# Patient Record
Sex: Female | Born: 1975
Health system: Southern US, Community
[De-identification: ages and names within clinical notes are randomized; demographics above are authoritative.]

## PROBLEM LIST (undated history)

## (undated) DIAGNOSIS — K219 Gastro-esophageal reflux disease without esophagitis: Secondary | ICD-10-CM

## (undated) DIAGNOSIS — M722 Plantar fascial fibromatosis: Secondary | ICD-10-CM

## (undated) DIAGNOSIS — E039 Hypothyroidism, unspecified: Secondary | ICD-10-CM

## (undated) DIAGNOSIS — Z973 Presence of spectacles and contact lenses: Secondary | ICD-10-CM

## (undated) DIAGNOSIS — D649 Anemia, unspecified: Secondary | ICD-10-CM

## (undated) DIAGNOSIS — G43909 Migraine, unspecified, not intractable, without status migrainosus: Secondary | ICD-10-CM

## (undated) DIAGNOSIS — R112 Nausea with vomiting, unspecified: Secondary | ICD-10-CM

## (undated) DIAGNOSIS — Q2112 Patent foramen ovale: Secondary | ICD-10-CM

## (undated) DIAGNOSIS — K449 Diaphragmatic hernia without obstruction or gangrene: Secondary | ICD-10-CM

## (undated) DIAGNOSIS — Z9889 Other specified postprocedural states: Secondary | ICD-10-CM

## (undated) DIAGNOSIS — Z8489 Family history of other specified conditions: Secondary | ICD-10-CM

## (undated) DIAGNOSIS — T8859XA Other complications of anesthesia, initial encounter: Secondary | ICD-10-CM

## (undated) DIAGNOSIS — C801 Malignant (primary) neoplasm, unspecified: Secondary | ICD-10-CM

## (undated) HISTORY — DX: Plantar fascial fibromatosis: M72.2

## (undated) HISTORY — PX: AUGMENTATION MAMMAPLASTY: SUR837

## (undated) HISTORY — DX: Migraine, unspecified, not intractable, without status migrainosus: G43.909

---

## 1999-10-17 ENCOUNTER — Other Ambulatory Visit: Admission: RE | Admit: 1999-10-17 | Discharge: 1999-10-17 | Payer: Self-pay | Admitting: Unknown Physician Specialty

## 2000-10-19 ENCOUNTER — Other Ambulatory Visit: Admission: RE | Admit: 2000-10-19 | Discharge: 2000-10-19 | Payer: Self-pay | Admitting: Family Medicine

## 2001-10-22 ENCOUNTER — Other Ambulatory Visit: Admission: RE | Admit: 2001-10-22 | Discharge: 2001-10-22 | Payer: Self-pay | Admitting: Family Medicine

## 2002-04-07 HISTORY — PX: OTHER SURGICAL HISTORY: SHX169

## 2002-11-20 ENCOUNTER — Other Ambulatory Visit: Admission: RE | Admit: 2002-11-20 | Discharge: 2002-11-20 | Payer: Self-pay | Admitting: Family Medicine

## 2005-10-17 ENCOUNTER — Ambulatory Visit: Payer: Self-pay | Admitting: Family Medicine

## 2007-12-06 HISTORY — PX: TUBAL LIGATION: SHX77

## 2011-08-24 ENCOUNTER — Encounter (INDEPENDENT_AMBULATORY_CARE_PROVIDER_SITE_OTHER): Payer: Self-pay | Admitting: Surgery

## 2011-08-24 ENCOUNTER — Ambulatory Visit (INDEPENDENT_AMBULATORY_CARE_PROVIDER_SITE_OTHER): Payer: Commercial Managed Care - PPO | Admitting: Surgery

## 2011-08-24 NOTE — Progress Notes (Signed)
Re:   Suzanne Green DOB:   Dec 10, 1975 MRN:   [id number]  ASSESSMENT AND PLAN: 1.  Morbid obesity.  Per the 1991 NIH Consensus Statement, the patient is a candidate for bariatric surgery.  The patient attended our initial information session and reviewed the types of bariatric surgery.    The patient is interested in the Roux en Y Gastric Bypass.  I discussed with the patient the indications and risks of bariatric surgery.  The potential risks of surgery include, but are not limited to, bleeding, infection, leak from the bowel, DVT and PE, open surgery, long term nutrition consequences, and death.  We talked some about how the surgery for her retroperitoneal tumor could effect the gastric bypass surgery.  The patient understands the importance of compliance and long term follow-up with our group after surgery.  From here we will obtain lab tests, x-rays, nutrition consult, and psych consult.  I also talked about weight loss between today and her surgery.  That any weight she looses will only make her surgery safer.  2.  Migraines.  To become new patient of [facility]. 3.  Prior abdominal surgery for retroperitoneal tumor. 4.  Left ankle tendon injury - August 2012 5.  Plantar fasciitis.  Chief Complaint  Patient presents with  . Bariatric Pre-op    New Bariatric - Initial   REFERRING PHYSICIAN: Marylen Ponto, MD, MD  HISTORY OF PRESENT ILLNESS: Suzanne Green is a 36 y.o. (DOB: [birth year])  white female whose primary care physician is Marylen Ponto, MD, MD and comes to me today for bariatric surgery, she is leaning towards the gastric bypass. The patient has been overweight much of her adult life. She has been to one of our seminars, but cannot remember the physician who spoke. She is interested in a gastric bypass. She has tried Weight Watchers, Atkins diet, bariatric clinic, weight loss clinic, [employer], carpet modified diet, and calorie counting. She had the most  success with the bariatric clinic. They gave her phentermine in addition to her diet.  I have encouraged to also try to attend one of our support group meetings so she can meet someone who has gone through the surgery.  She did say that her husband has rotated through [facility] and taken care of some of the bariatric patients there.  She has a significant family history of coronary artery disease and early death.   Past Medical History  Diagnosis Date  . Migraines     since 1998     Past Surgical History  Procedure Date  . Cesarean section 12/26/07, 10/31/04  . Tubal ligation 12/2007  . Abdominal cyst removed 04/2002     Current Outpatient Prescriptions  Medication Sig Dispense Refill  . rizatriptan (MAXALT) 10 MG tablet Take 10 mg by mouth as needed. May repeat in 2 hours if needed        No Known Allergies  REVIEW OF SYSTEMS: Skin:  No history of rash.  No history of abnormal moles. Infection:  No history of hepatitis or HIV.  No history of MRSA. Neurologic:  History of migraine headaches well controlled on her current meds.  She is to see [facility].  She has never seen anyone there before and cannot remember the name of the doctor she is to see. Cardiac:  No history of hypertension. No history of heart disease.  No history of prior cardiac catheterization.  No history of seeing a cardiologist. Pulmonary:  Pneumothorax  at age 75. Treated with needle. Gets bronchitis easily.  Endocrine:  No diabetes. No thyroid disease. Gastrointestinal:  No history of stomach disease.  No history of liver disease.  No history of gall bladder disease.  No history of pancreas disease.  No history of colon disease. Urologic:  No history of kidney stones.  No history of bladder infections. Musculoskeletal:  Has plantar fasciitis.  Has torn 3 tendons in her right ankle.  Sees a podiatrist. Hematologic:  No bleeding disorder.  No history of anemia.  Not anticoagulated. Psycho-social:   The patient is oriented.   The patient has no obvious psychologic or social impairment to understanding our conversation and plan.  SOCIAL and FAMILY HISTORY: She works as a Engineer, civil (consulting) at [employer].  Her husband is also a Pharmacologist. She has three children: 17,6,3.  PHYSICAL EXAM: BP 110/70  Pulse 70  Temp(Src) 97.6 F (36.4 C) (Temporal)  Resp 18  Ht 5' 4.5" (1.638 m)  Wt 279 lb 3.2 oz (126.644 kg)  BMI 47.18 kg/m2  General: Obese WF who is alert and generally healthy appearing.  HEENT: Normal. Pupils equal. Good dentition. Neck: Supple. No mass.  No thyroid mass.  Carotid pulse okay with no bruit. Lymph Nodes:  No supraclavicular or cervical nodes. Lungs: Clear to auscultation and symmetric breath sounds. Heart:  RRR. No murmur or rub.  Abdomen: Soft. No mass. No tenderness. No hernia. Normal bowel sounds.  Infraumbilical mid line abdominal scar. Rectal: Guaiac neg stool and no mass. Extremities:  Good strength and ROM  in upper and lower extremities. Neurologic:  Grossly intact to motor and sensory function. Psychiatric: Has normal mood and affect. Behavior is normal.   DATA REVIEWED: Letter of support from Dr. Leonor Liv and other copied material in paper chart.  [name], MD,  [facility] [address] [place]  [address]   [place], [place]    [address] Phone:  [phone] FAX:  [phone]

## 2011-08-29 ENCOUNTER — Other Ambulatory Visit (INDEPENDENT_AMBULATORY_CARE_PROVIDER_SITE_OTHER): Payer: Self-pay

## 2011-08-29 DIAGNOSIS — E669 Obesity, unspecified: Secondary | ICD-10-CM

## 2011-08-30 ENCOUNTER — Encounter (HOSPITAL_COMMUNITY): Admission: RE | Payer: Self-pay | Source: Ambulatory Visit

## 2011-08-30 ENCOUNTER — Ambulatory Visit (HOSPITAL_COMMUNITY): Admission: RE | Admit: 2011-08-30 | Payer: Commercial Managed Care - PPO | Source: Ambulatory Visit | Admitting: Surgery

## 2011-08-30 SURGERY — BREATH TEST, FOR HELICOBACTER PYLORI

## 2011-09-05 ENCOUNTER — Other Ambulatory Visit: Payer: Self-pay

## 2011-09-05 ENCOUNTER — Ambulatory Visit (HOSPITAL_COMMUNITY)
Admission: RE | Admit: 2011-09-05 | Discharge: 2011-09-05 | Disposition: A | Discharge status: Home / Self Care | Payer: 59 | Source: Ambulatory Visit | Attending: Surgery | Admitting: Surgery

## 2011-09-05 DIAGNOSIS — K219 Gastro-esophageal reflux disease without esophagitis: Secondary | ICD-10-CM | POA: Insufficient documentation

## 2011-09-05 DIAGNOSIS — K7689 Other specified diseases of liver: Secondary | ICD-10-CM | POA: Insufficient documentation

## 2011-09-13 LAB — CBC WITH DIFFERENTIAL/PLATELET
Basophils Absolute: 0.1 10*3/uL (ref 0.0–0.1)
Basophils Relative: 1 % (ref 0–1)
Eosinophils Relative: 2 % (ref 0–5)
HCT: 38.1 % (ref 36.0–46.0)
Hemoglobin: 11.6 g/dL — ABNORMAL LOW (ref 12.0–15.0)
MCH: 27.4 pg (ref 26.0–34.0)
MCHC: 30.4 g/dL (ref 30.0–36.0)
MCV: 90.1 fL (ref 78.0–100.0)
Monocytes Absolute: 0.6 10*3/uL (ref 0.1–1.0)
Monocytes Relative: 7 % (ref 3–12)
Neutro Abs: 4.3 10*3/uL (ref 1.7–7.7)
RDW: 14.4 % (ref 11.5–15.5)

## 2011-09-13 LAB — COMPREHENSIVE METABOLIC PANEL
AST: 9 U/L (ref 0–37)
Albumin: 3.9 g/dL (ref 3.5–5.2)
Alkaline Phosphatase: 66 U/L (ref 39–117)
BUN: 18 mg/dL (ref 6–23)
Calcium: 9.6 mg/dL (ref 8.4–10.5)
Creat: 0.92 mg/dL (ref 0.50–1.10)
Glucose, Bld: 96 mg/dL (ref 70–99)
Potassium: 4.5 mEq/L (ref 3.5–5.3)

## 2011-09-14 ENCOUNTER — Encounter: Payer: Self-pay | Admitting: *Deleted

## 2011-09-14 ENCOUNTER — Encounter: Payer: 59 | Attending: Surgery | Admitting: *Deleted

## 2011-09-14 DIAGNOSIS — Z01818 Encounter for other preprocedural examination: Secondary | ICD-10-CM | POA: Insufficient documentation

## 2011-09-14 DIAGNOSIS — Z713 Dietary counseling and surveillance: Secondary | ICD-10-CM | POA: Insufficient documentation

## 2011-09-14 NOTE — Patient Instructions (Signed)
   Follow Pre-Op Nutrition Goals to prepare for Gastric Bypass Surgery.   Call the [facility] at [phone] once you have been given your surgery date to enrolled in the Pre-Op Nutrition Class. You will need to attend this nutrition class 3-4 weeks prior to your surgery. 

## 2011-09-14 NOTE — Progress Notes (Signed)
  Pre-Op Assessment Visit: Pre-Operative Gastric Bypass Surgery  Medical Nutrition Therapy:  Appt start time: 1100 end time:  1200.  Patient was seen on 09/14/2011 for Pre-Operative Gastric Bypass Nutrition Assessment. Assessment and letter of approval faxed to [facility] B[facility]ric [facility] Program coordinator on 09/14/2011.  Approval letter sent to [facility] and will be available in the chart under the media tab.  Handouts given during visit include:  Pre-Op Goals Handout  Patient to call for Pre-Op and Post-Op Nutrition Education at the [facility] when surgery is scheduled.

## 2011-09-18 ENCOUNTER — Encounter (HOSPITAL_COMMUNITY)
Admission: RE | Disposition: A | Discharge status: Home Health | Payer: Self-pay | Source: Ambulatory Visit | Attending: Surgery

## 2011-09-18 ENCOUNTER — Ambulatory Visit (HOSPITAL_COMMUNITY)
Admission: RE | Admit: 2011-09-18 | Discharge: 2011-09-18 | Disposition: A | Discharge status: Home Health | Payer: 59 | Source: Ambulatory Visit | Attending: Surgery | Admitting: Surgery

## 2011-09-18 DIAGNOSIS — Z01818 Encounter for other preprocedural examination: Secondary | ICD-10-CM | POA: Insufficient documentation

## 2011-09-18 HISTORY — PX: BREATH TEK H PYLORI: SHX5422

## 2011-09-18 SURGERY — BREATH TEST, FOR HELICOBACTER PYLORI

## 2011-09-19 ENCOUNTER — Encounter (HOSPITAL_COMMUNITY): Payer: Self-pay | Admitting: Surgery

## 2011-11-15 ENCOUNTER — Other Ambulatory Visit (INDEPENDENT_AMBULATORY_CARE_PROVIDER_SITE_OTHER): Payer: Self-pay | Admitting: Surgery

## 2011-11-30 ENCOUNTER — Ambulatory Visit (INDEPENDENT_AMBULATORY_CARE_PROVIDER_SITE_OTHER): Payer: Commercial Managed Care - PPO | Admitting: Surgery

## 2011-11-30 NOTE — Progress Notes (Signed)
Re:   Suzanne Green DOB:   Dec 02, 1975 MRN:   [id number]  ASSESSMENT AND PLAN: 1.  Morbid obesity.  Weight - 279, BMI - 47.2.  She has been through the pre op labs, x-rays, and evaluation.  For RYGB Dec 19, 2011.  The patient is interested in the Roux en Y Gastric Bypass.  I reviewed with the patient the indications and risks of bariatric surgery.  The potential risks of surgery include, but are not limited to, bleeding, infection, leak from the bowel, DVT and PE, open surgery, long term nutrition consequences, and death. We talked about the hospitalization.  The patient understands the importance of compliance and long term follow-up with our group after surgery.  I reviewed her tests with her. Her dad came with her to the office today.  2.  Migraines.  To become new patient of [facility]. 3.  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  Left ankle tendon injury - August 2012 5.  Plantar fasciitis.  Chief Complaint  Patient presents with  . Bariatric Pre-op   REFERRING PHYSICIAN: Marylen Ponto, MD, MD  HISTORY OF PRESENT ILLNESS: Suzanne Green is a 36 y.o. (DOB: [birth year])  white female whose primary care physician is Marylen Ponto, MD, MD and comes to me today for pre op visit for bariatric surgery.  She is scheduled for a gastric bypass.  She seems to understand the planned surgery, the hospitalization, post op course and potential complications.  She did not have a lot of questions.  Her dad is with her.  Her husband could not come with her because he works third shift in [employer].  She has lost a little bit of weight.  I encouraged he that the more she looses the better.  Her blood work looks good - TSH - 2.167 - 08/29/2011. UGI - 09/05/2011 - Trace of reflux, no HH. [place] - 09/05/2011 - Moderate steatosis. Dr. Cyndia Skeeters - 10/09/2011 - okay for surgery  She has a significant family history of coronary artery disease and early  death.   Past Medical History  Diagnosis Date  . Migraines     since 1998  . Plantar fasciitis      Past Surgical History  Procedure Date  . Cesarean section 12/26/07, 10/31/04  . Tubal ligation 12/2007  . Abdominal cyst removed 04/2002  . Breath tek h pylori 09/18/2011    Procedure: BREATH TEK H PYLORI;  Surgeon: Kandis Cocking, MD;  Location: [facility];  Service: General;  Laterality: N/A;     Current Outpatient Prescriptions  Medication Sig Dispense Refill  . ibuprofen (ADVIL,MOTRIN) 600 MG tablet Take 600 mg by mouth 3 (three) times daily as needed.      . rizatriptan (MAXALT) 10 MG tablet Take 10 mg by mouth as needed. May repeat in 2 hours if needed        No Known Allergies  REVIEW OF SYSTEMS: Skin:  No history of rash.  No history of abnormal moles. Infection:  No history of hepatitis or HIV.  No history of MRSA. Neurologic:  History of migraine headaches well controlled on her current meds.  She is to see [facility].  She has never seen anyone there before and cannot remember the name of the doctor she is to see. Cardiac:  No history of hypertension. No history of heart disease.  No history of prior cardiac catheterization.  No history of seeing a cardiologist. Pulmonary:  Pneumothorax at age 9. Treated with needle. Gets bronchitis easily.  Endocrine:  No diabetes. No thyroid disease. Gastrointestinal:  No history of stomach disease.  No history of liver disease.  No history of gall bladder disease.  No history of pancreas disease.  No history of colon disease. Urologic:  No history of kidney stones.  No history of bladder infections. Musculoskeletal:  Has plantar fasciitis.  Has torn 3 tendons in her right ankle.  Sees a podiatrist. Hematologic:  No bleeding disorder.  No history of anemia.  Not anticoagulated. Psycho-social:  The patient is oriented.   The patient has no obvious psychologic or social impairment to understanding our conversation and  plan.  SOCIAL and FAMILY HISTORY: She works as a Engineer, civil (consulting) at [employer].   Her husband is also a Pharmacologist.  I think he works at [employer]. She has three children: 17,6,3.  PHYSICAL EXAM: BP 120/88  Pulse 78  Temp(Src) 97.8 F (36.6 C) (Temporal)  Resp 20  Ht 5' 4.5" (1.638 m)  Wt 272 lb (123.378 kg)  BMI 45.97 kg/m2  General: Obese WF who is alert and generally healthy appearing.  HEENT: Normal. Pupils equal. Good dentition. Neck: Supple. No mass.  No thyroid mass.  Carotid pulse okay with no bruit. Lymph Nodes:  No supraclavicular or cervical nodes. Lungs: Clear to auscultation and symmetric breath sounds. Heart:  RRR. No murmur or rub.  Abdomen: Soft. No mass. No tenderness. No hernia. Normal bowel sounds.  Infraumbilical mid line abdominal scar. Rectal: Guaiac neg stool and no mass. Extremities:  Good strength and ROM  in upper and lower extremities. Neurologic:  Grossly intact to motor and sensory function. Psychiatric: Has normal mood and affect. Behavior is normal.   DATA REVIEWED: Labs and x-rays in HPI.  [name], MD,  [facility] [address] [place]  [address]   [place], [place]    [address] Phone:  [phone] FAX:  [phone]

## 2011-12-07 ENCOUNTER — Encounter: Payer: 59 | Attending: Surgery | Admitting: *Deleted

## 2011-12-07 ENCOUNTER — Encounter: Payer: Self-pay | Admitting: *Deleted

## 2011-12-07 DIAGNOSIS — Z01818 Encounter for other preprocedural examination: Secondary | ICD-10-CM | POA: Insufficient documentation

## 2011-12-07 DIAGNOSIS — Z713 Dietary counseling and surveillance: Secondary | ICD-10-CM | POA: Insufficient documentation

## 2011-12-07 NOTE — Progress Notes (Signed)
  Bariatric Class:  Appt start time: 0800 end time:  0930.  Pre-Operative Nutrition Class  Patient was seen on 12/07/2011 for Pre-Operative Bariatric Surgery Education at the [facility].  Surgery date: 12/19/11 Surgery type: RYGB Last weight: 272.0 lbs  Samples given per MNT protocol:  Celebrate Vitamins  MVI [facility]) - Lot # [id number]; Exp: 06/14  Calcium Citrate (Berries & Cream) - Lot # [id number]; Exp: 10/14  Sublingual B12 Valentino Saxon) - Lot # [id number]; Exp: 07/14 Bariatric Advantage  MVI [facility]) - Lot # [id number]; Exp: 09/13  MVI Allyson Sabal) - Lot # [id number] [id number]; Exp: 09/13  Calcium Citrate w/ Vit D (Chocolate) - Lot # [id number]; Exp: 09/13  Calcium Citrate w/ Vit D (Cinnamon) - Lot # [id number]; Exp: 09/13 Unjury Protein Powder  Vanilla - Lot # [id number]; Exp: 08/14  The following the learning objective met by the patient during this course:  Identifies Pre-Op Dietary Goals and will begin 2 weeks pre-operatively  Identifies appropriate sources of fluids and proteins   States protein recommendations and appropriate sources pre and post-operatively  Identifies Post-Operative Dietary Goals and will follow for 2 weeks post-operatively  Identifies appropriate multivitamin and calcium sources  Describes the need for physical activity post-operatively and will follow MD recommendations  States when to call healthcare provider regarding medication questions or post-operative complications  Handouts given during class include:  Pre-Op Bariatric Surgery Diet Handout  Protein Shake Handout  Post-Op Bariatric Surgery Nutrition Handout  BELT Program Information Flyer  Support Group Information Flyer  Follow-Up Plan: Patient will follow-up at [facility] 2 weeks post operatively for diet advancement per MD.

## 2011-12-07 NOTE — Patient Instructions (Signed)
Follow:   Pre-Op Diet per MD 2 weeks prior to surgery  Phase 2- Liquids (clear/full) 2 weeks after surgery  Vitamin/Mineral/Calcium guidelines for purchasing bariatric supplements  Exercise guidelines pre and post-op per MD  Follow-up at [facility] in 2 weeks post-op for diet advancement. Contact [place] as needed with questions/concerns. 

## 2011-12-13 ENCOUNTER — Encounter (HOSPITAL_COMMUNITY): Payer: Self-pay | Admitting: Pharmacy Technician

## 2011-12-14 ENCOUNTER — Encounter (HOSPITAL_COMMUNITY): Payer: Self-pay

## 2011-12-14 ENCOUNTER — Encounter (HOSPITAL_COMMUNITY)
Admission: RE | Admit: 2011-12-14 | Discharge: 2011-12-14 | Disposition: A | Discharge status: Home / Self Care | Payer: 59 | Source: Ambulatory Visit | Attending: Surgery | Admitting: Surgery

## 2011-12-14 HISTORY — DX: Gastro-esophageal reflux disease without esophagitis: K21.9

## 2011-12-14 LAB — CBC
Hemoglobin: 12 g/dL (ref 12.0–15.0)
MCH: 28 pg (ref 26.0–34.0)
Platelets: 294 10*3/uL (ref 150–400)
RBC: 4.28 MIL/uL (ref 3.87–5.11)

## 2011-12-14 LAB — HCG, SERUM, QUALITATIVE: Preg, Serum: NEGATIVE

## 2011-12-14 LAB — SURGICAL PCR SCREEN: Staphylococcus aureus: NEGATIVE

## 2011-12-14 NOTE — Patient Instructions (Signed)
20 Suzanne Green  12/14/2011   Your procedure is scheduled on:  Tuesday 12/19/2011 at 0715am  Report to [facility] at 0515 AM.  Call this number if you have problems the morning of surgery: [phone]   Remember:FOLLOW BOWEL PREP INSTRUCTIONS FROM Dr. Allene Pyo office on 12/18/2011!   Do not eat food: after 12/17/2011  May have clear liquids:until Midnight .    Take these medicines the morning of surgery with A SIP OF WATER: NONE   Do not wear jewelry, make-up or nail polish.  Do not wear lotions, powders, or perfumes. .  Do not shave 48 hours prior to surgery-women only-shaving legs.  Do not bring valuables to the hospital.  Contacts, dentures or bridgework may not be worn into surgery.  Leave suitcase in the car. After surgery it may be brought to your room.  For patients admitted to the hospital, checkout time is 11:00 AM the day of discharge.       Special Instructions: CHG Shower Use Special Wash: 1/2 bottle night before surgery and 1/2 bottle morning of surgery.   Please read over the following fact sheets that you were given: MRSA Information, Incentive Spirometry sheet, Sleep apnea sheet

## 2011-12-18 NOTE — Anesthesia Preprocedure Evaluation (Addendum)
Anesthesia Evaluation  Patient identified by MRN, date of birth, ID band Patient awake    Reviewed: Allergy & Precautions, H&P , NPO status , Patient's Chart, lab work & pertinent test results  Airway Mallampati: II TM Distance: <3 FB Neck ROM: Full    Dental No notable dental hx.    Pulmonary neg pulmonary ROS,  breath sounds clear to auscultation  Pulmonary exam normal       Cardiovascular negative cardio ROS  Rhythm:Regular Rate:Normal     Neuro/Psych negative neurological ROS  negative psych ROS   GI/Hepatic Neg liver ROS, GERD-  Medicated,  Endo/Other  Morbid obesity  Renal/GU negative Renal ROS  negative genitourinary   Musculoskeletal negative musculoskeletal ROS (+)   Abdominal   Peds negative pediatric ROS (+)  Hematology negative hematology ROS (+)   Anesthesia Other Findings   Reproductive/Obstetrics negative OB ROS                           Anesthesia Physical Anesthesia Plan  ASA: III  Anesthesia Plan: General   Post-op Pain Management:    Induction: Intravenous  Airway Management Planned: Oral ETT  Additional Equipment:   Intra-op Plan:   Post-operative Plan: Extubation in OR  Informed Consent: I have reviewed the patients History and Physical, chart, labs and discussed the procedure including the risks, benefits and alternatives for the proposed anesthesia with the patient or authorized representative who has indicated his/her understanding and acceptance.   Dental advisory given  Plan Discussed with: CRNA  Anesthesia Plan Comments:         Anesthesia Quick Evaluation

## 2011-12-19 ENCOUNTER — Ambulatory Visit (HOSPITAL_COMMUNITY): Payer: 59 | Admitting: Anesthesiology

## 2011-12-19 ENCOUNTER — Encounter (HOSPITAL_COMMUNITY): Payer: Self-pay | Admitting: Surgery

## 2011-12-19 ENCOUNTER — Encounter (HOSPITAL_COMMUNITY): Payer: Self-pay | Admitting: Anesthesiology

## 2011-12-19 ENCOUNTER — Encounter (HOSPITAL_COMMUNITY)
Admission: RE | Disposition: A | Discharge status: Home / Self Care | Payer: Self-pay | Source: Ambulatory Visit | Attending: Surgery

## 2011-12-19 ENCOUNTER — Encounter (HOSPITAL_COMMUNITY): Payer: Self-pay | Admitting: *Deleted

## 2011-12-19 ENCOUNTER — Inpatient Hospital Stay (HOSPITAL_COMMUNITY)
Admission: RE | Admit: 2011-12-19 | Discharge: 2011-12-21 | DRG: 621 | Disposition: A | Discharge status: Home / Self Care | Payer: 59 | Source: Ambulatory Visit | Attending: Surgery | Admitting: Surgery

## 2011-12-19 DIAGNOSIS — Z6841 Body Mass Index (BMI) 40.0 and over, adult: Secondary | ICD-10-CM

## 2011-12-19 DIAGNOSIS — Z01812 Encounter for preprocedural laboratory examination: Secondary | ICD-10-CM

## 2011-12-19 DIAGNOSIS — G43909 Migraine, unspecified, not intractable, without status migrainosus: Secondary | ICD-10-CM | POA: Diagnosis present

## 2011-12-19 DIAGNOSIS — M722 Plantar fascial fibromatosis: Secondary | ICD-10-CM | POA: Diagnosis present

## 2011-12-19 HISTORY — PX: GASTRIC ROUX-EN-Y: SHX5262

## 2011-12-19 LAB — HEMOGLOBIN AND HEMATOCRIT, BLOOD
HCT: 33.7 % — ABNORMAL LOW (ref 36.0–46.0)
Hemoglobin: 11.3 g/dL — ABNORMAL LOW (ref 12.0–15.0)

## 2011-12-19 SURGERY — LAPAROSCOPIC ROUX-EN-Y GASTRIC
Anesthesia: General | Site: Abdomen | Wound class: Clean Contaminated

## 2011-12-19 MED ORDER — UNJURY CHICKEN SOUP POWDER: 2.0000 [oz_av] | Freq: Four times a day (QID) | ORAL | Status: DC

## 2011-12-19 MED ORDER — HYDROMORPHONE HCL PF 1 MG/ML IJ SOLN
INTRAMUSCULAR | Status: DC | PRN
  Administered 2011-12-19 (×2): 1 mg via INTRAVENOUS

## 2011-12-19 MED ORDER — BUPIVACAINE HCL (PF) 0.25 % IJ SOLN
INTRAMUSCULAR | Status: DC | PRN
  Administered 2011-12-19: 30 mL

## 2011-12-19 MED ORDER — KCL IN DEXTROSE-NACL 20-5-0.45 MEQ/L-%-% IV SOLN
INTRAVENOUS | Status: DC
  Administered 2011-12-19 (×2): via INTRAVENOUS
  Administered 2011-12-20: 150 mL via INTRAVENOUS
  Administered 2011-12-21: 05:00:00 via INTRAVENOUS
  Filled 2011-12-19 (×8): qty 1000

## 2011-12-19 MED ORDER — UNJURY VANILLA POWDER
2.0000 [oz_av] | Freq: Four times a day (QID) | ORAL | Status: DC
  Administered 2011-12-21: 2 [oz_av] via ORAL

## 2011-12-19 MED ORDER — KETOROLAC TROMETHAMINE 30 MG/ML IJ SOLN
INTRAMUSCULAR | Status: AC
  Filled 2011-12-19: qty 1

## 2011-12-19 MED ORDER — LIDOCAINE HCL (CARDIAC) 20 MG/ML IV SOLN
INTRAVENOUS | Status: DC | PRN
  Administered 2011-12-19: 50 mg via INTRAVENOUS

## 2011-12-19 MED ORDER — UNJURY CHOCOLATE CLASSIC POWDER: 2.0000 [oz_av] | Freq: Four times a day (QID) | ORAL | Status: DC

## 2011-12-19 MED ORDER — HYDROMORPHONE HCL PF 1 MG/ML IJ SOLN
0.2500 mg | INTRAMUSCULAR | Status: DC | PRN
  Administered 2011-12-19 (×2): 0.5 mg via INTRAVENOUS

## 2011-12-19 MED ORDER — DEXAMETHASONE SODIUM PHOSPHATE 10 MG/ML IJ SOLN
INTRAMUSCULAR | Status: DC | PRN
  Administered 2011-12-19: 5 mg via INTRAVENOUS

## 2011-12-19 MED ORDER — HEPARIN SODIUM (PORCINE) 5000 UNIT/ML IJ SOLN
INTRAMUSCULAR | Status: AC
  Administered 2011-12-19: 5000 [IU] via SUBCUTANEOUS
  Filled 2011-12-19: qty 1

## 2011-12-19 MED ORDER — HEPARIN SODIUM (PORCINE) 5000 UNIT/ML IJ SOLN
5000.0000 [IU] | Freq: Three times a day (TID) | INTRAMUSCULAR | Status: DC
  Administered 2011-12-19 – 2011-12-21 (×4): 5000 [IU] via SUBCUTANEOUS
  Filled 2011-12-19 (×8): qty 1

## 2011-12-19 MED ORDER — ACETAMINOPHEN 10 MG/ML IV SOLN
INTRAVENOUS | Status: DC | PRN
  Administered 2011-12-19: 1000 mg via INTRAVENOUS

## 2011-12-19 MED ORDER — KETOROLAC TROMETHAMINE 30 MG/ML IJ SOLN
15.0000 mg | Freq: Once | INTRAMUSCULAR | Status: AC | PRN
  Administered 2011-12-19: 30 mg via INTRAVENOUS

## 2011-12-19 MED ORDER — HEPARIN SODIUM (PORCINE) 5000 UNIT/ML IJ SOLN: 5000.0000 [IU] | Freq: Once | INTRAMUSCULAR | Status: DC

## 2011-12-19 MED ORDER — SUCCINYLCHOLINE CHLORIDE 20 MG/ML IJ SOLN
INTRAMUSCULAR | Status: DC | PRN
  Administered 2011-12-19: 100 mg via INTRAVENOUS

## 2011-12-19 MED ORDER — MORPHINE SULFATE 2 MG/ML IJ SOLN
2.0000 mg | INTRAMUSCULAR | Status: DC | PRN
  Administered 2011-12-19 (×2): 2 mg via INTRAVENOUS
  Administered 2011-12-19 – 2011-12-20 (×4): 4 mg via INTRAVENOUS
  Filled 2011-12-19 (×3): qty 1
  Filled 2011-12-19 (×3): qty 2
  Filled 2011-12-19: qty 1

## 2011-12-19 MED ORDER — LACTATED RINGERS IR SOLN
Status: DC | PRN
  Administered 2011-12-19: 3000 mL

## 2011-12-19 MED ORDER — ACETAMINOPHEN 10 MG/ML IV SOLN
INTRAVENOUS | Status: AC
  Filled 2011-12-19: qty 100

## 2011-12-19 MED ORDER — TISSEEL VH 10 ML EX KIT
PACK | CUTANEOUS | Status: DC | PRN
  Administered 2011-12-19 (×2): 10 mL

## 2011-12-19 MED ORDER — FIBRIN SEALANT COMPONENT 5 ML EX KIT
PACK | CUTANEOUS | Status: AC
  Filled 2011-12-19: qty 4

## 2011-12-19 MED ORDER — HYDROMORPHONE HCL PF 1 MG/ML IJ SOLN
INTRAMUSCULAR | Status: AC
  Filled 2011-12-19: qty 1

## 2011-12-19 MED ORDER — PROPOFOL 10 MG/ML IV BOLUS
INTRAVENOUS | Status: DC | PRN
  Administered 2011-12-19: 200 mg via INTRAVENOUS

## 2011-12-19 MED ORDER — DEXTROSE 5 % IV SOLN
2.0000 g | INTRAVENOUS | Status: AC
  Administered 2011-12-19: 2 g via INTRAVENOUS

## 2011-12-19 MED ORDER — CEFOXITIN SODIUM-DEXTROSE 1-4 GM-% IV SOLR (PREMIX)
INTRAVENOUS | Status: AC
  Filled 2011-12-19: qty 100

## 2011-12-19 MED ORDER — OXYCODONE-ACETAMINOPHEN 5-325 MG/5ML PO SOLN
5.0000 mL | ORAL | Status: DC | PRN
  Administered 2011-12-20: 10 mL via ORAL
  Administered 2011-12-20 – 2011-12-21 (×3): 5 mL via ORAL
  Filled 2011-12-19: qty 5
  Filled 2011-12-19: qty 10
  Filled 2011-12-19 (×2): qty 5

## 2011-12-19 MED ORDER — LACTATED RINGERS IV SOLN
INTRAVENOUS | Status: DC | PRN
  Administered 2011-12-19 (×3): via INTRAVENOUS

## 2011-12-19 MED ORDER — ONDANSETRON HCL 4 MG/2ML IJ SOLN
INTRAMUSCULAR | Status: DC | PRN
  Administered 2011-12-19: 4 mg via INTRAVENOUS

## 2011-12-19 MED ORDER — CISATRACURIUM BESYLATE (PF) 10 MG/5ML IV SOLN
INTRAVENOUS | Status: DC | PRN
  Administered 2011-12-19: 6 mg via INTRAVENOUS
  Administered 2011-12-19: 4 mg via INTRAVENOUS
  Administered 2011-12-19: 2 mg via INTRAVENOUS

## 2011-12-19 MED ORDER — BUPIVACAINE HCL (PF) 0.25 % IJ SOLN
INTRAMUSCULAR | Status: AC
  Filled 2011-12-19: qty 30

## 2011-12-19 MED ORDER — PROMETHAZINE HCL 25 MG/ML IJ SOLN
6.2500 mg | INTRAMUSCULAR | Status: DC | PRN
  Administered 2011-12-19: 6.25 mg via INTRAVENOUS

## 2011-12-19 MED ORDER — ONDANSETRON HCL 4 MG/2ML IJ SOLN
4.0000 mg | INTRAMUSCULAR | Status: DC | PRN
  Administered 2011-12-20: 4 mg via INTRAVENOUS
  Filled 2011-12-19: qty 2

## 2011-12-19 MED ORDER — ACETAMINOPHEN 160 MG/5ML PO SOLN: 650.0000 mg | ORAL | Status: DC | PRN

## 2011-12-19 MED ORDER — NEOSTIGMINE METHYLSULFATE 1 MG/ML IJ SOLN
INTRAMUSCULAR | Status: DC | PRN
  Administered 2011-12-19: 5 mg via INTRAVENOUS

## 2011-12-19 MED ORDER — SUFENTANIL CITRATE 50 MCG/ML IV SOLN
INTRAVENOUS | Status: DC | PRN
  Administered 2011-12-19 (×3): 5 ug via INTRAVENOUS
  Administered 2011-12-19 (×3): 10 ug via INTRAVENOUS
  Administered 2011-12-19 (×4): 5 ug via INTRAVENOUS

## 2011-12-19 MED ORDER — GLYCOPYRROLATE 0.2 MG/ML IJ SOLN
INTRAMUSCULAR | Status: DC | PRN
  Administered 2011-12-19: .8 mg via INTRAVENOUS

## 2011-12-19 MED ORDER — PROMETHAZINE HCL 25 MG/ML IJ SOLN
INTRAMUSCULAR | Status: AC
  Filled 2011-12-19: qty 1

## 2011-12-19 MED ORDER — ROCURONIUM BROMIDE 100 MG/10ML IV SOLN
INTRAVENOUS | Status: DC | PRN
  Administered 2011-12-19: 5 mg via INTRAVENOUS

## 2011-12-19 MED ORDER — METOCLOPRAMIDE HCL 5 MG/ML IJ SOLN
INTRAMUSCULAR | Status: DC | PRN
  Administered 2011-12-19: 10 mg via INTRAVENOUS

## 2011-12-19 MED ORDER — MIDAZOLAM HCL 5 MG/5ML IJ SOLN
INTRAMUSCULAR | Status: DC | PRN
  Administered 2011-12-19: 2 mg via INTRAVENOUS

## 2011-12-19 SURGICAL SUPPLY — 70 items
APPLICATOR COTTON TIP 6IN STRL (MISCELLANEOUS) IMPLANT
APPLIER CLIP ROT 13.4 12 LRG (CLIP)
BLADE SURG 15 STRL LF DISP TIS (BLADE) ×1 IMPLANT
BLADE SURG 15 STRL SS (BLADE) ×1
CABLE HIGH FREQUENCY MONO STRZ (ELECTRODE) IMPLANT
CANISTER SUCTION 2500CC (MISCELLANEOUS) ×2 IMPLANT
CLIP APPLIE ROT 13.4 12 LRG (CLIP) IMPLANT
CLIP SUT LAPRA TY ABSORB (SUTURE) ×4 IMPLANT
CLOTH BEACON ORANGE TIMEOUT ST (SAFETY) ×2 IMPLANT
COVER SURGICAL LIGHT HANDLE (MISCELLANEOUS) ×2 IMPLANT
CUTTER LINEAR ENDO ART 45 ETS (STAPLE) ×2 IMPLANT
DECANTER SPIKE VIAL GLASS SM (MISCELLANEOUS) ×4 IMPLANT
DERMABOND ADVANCED (GAUZE/BANDAGES/DRESSINGS) ×1
DERMABOND ADVANCED .7 DNX12 (GAUZE/BANDAGES/DRESSINGS) ×1 IMPLANT
DEVICE SUTURE ENDOST 10MM (ENDOMECHANICALS) ×2 IMPLANT
DISSECTOR BLUNT TIP ENDO 5MM (MISCELLANEOUS) IMPLANT
DRAIN PENROSE 18X1/4 LTX STRL (WOUND CARE) ×2 IMPLANT
DRAPE CAMERA CLOSED 9X96 (DRAPES) ×2 IMPLANT
DUPLOJECT EASY PREP 4ML (MISCELLANEOUS) ×2 IMPLANT
GAUZE SPONGE 4X4 16PLY XRAY LF (GAUZE/BANDAGES/DRESSINGS) ×2 IMPLANT
GLOVE BIOGEL PI IND STRL 7.0 (GLOVE) ×1 IMPLANT
GLOVE BIOGEL PI INDICATOR 7.0 (GLOVE) ×1
GLOVE SURG SIGNA 7.5 PF LTX (GLOVE) ×2 IMPLANT
GOWN STRL NON-REIN LRG LVL3 (GOWN DISPOSABLE) ×2 IMPLANT
GOWN STRL REIN XL XLG (GOWN DISPOSABLE) ×4 IMPLANT
HOVERMATT SINGLE USE (MISCELLANEOUS) ×2 IMPLANT
KIT BASIN OR (CUSTOM PROCEDURE TRAY) ×2 IMPLANT
KIT GASTRIC LAVAGE 34FR ADT (SET/KITS/TRAYS/PACK) ×2 IMPLANT
NEEDLE SPNL 22GX3.5 QUINCKE BK (NEEDLE) ×2 IMPLANT
NS IRRIG 1000ML POUR BTL (IV SOLUTION) ×2 IMPLANT
PACK CARDIOVASCULAR III (CUSTOM PROCEDURE TRAY) ×2 IMPLANT
PEN SKIN MARKING BROAD (MISCELLANEOUS) ×2 IMPLANT
POUCH SPECIMEN RETRIEVAL 10MM (ENDOMECHANICALS) IMPLANT
RELOAD 45 VASCULAR/THIN (ENDOMECHANICALS) ×4 IMPLANT
RELOAD BLUE (STAPLE) ×6 IMPLANT
RELOAD ENDO STITCH 2.0 (ENDOMECHANICALS) ×10
RELOAD GOLD (STAPLE) IMPLANT
RELOAD STAPLE TA45 3.5 REG BLU (ENDOMECHANICALS) ×4 IMPLANT
RELOAD WHITE ECR60W (STAPLE) IMPLANT
SCALPEL HARMONIC ACE (MISCELLANEOUS) ×2 IMPLANT
SCISSORS LAP 5X35 DISP (ENDOMECHANICALS) ×2 IMPLANT
SEALANT SURGICAL APPL DUAL CAN (MISCELLANEOUS) ×2 IMPLANT
SET IRRIG TUBING LAPAROSCOPIC (IRRIGATION / IRRIGATOR) ×2 IMPLANT
SLEEVE Z-THREAD 12X100MM (TROCAR) ×4 IMPLANT
SLEEVE Z-THREAD 5X100MM (TROCAR) ×2 IMPLANT
SOLUTION ANTI FOG 6CC (MISCELLANEOUS) ×2 IMPLANT
SPONGE GAUZE 4X4 12PLY (GAUZE/BANDAGES/DRESSINGS) ×2 IMPLANT
STAPLER STANDARD HANDLE (STAPLE) ×2 IMPLANT
STAPLER VISISTAT 35W (STAPLE) ×2 IMPLANT
SUT MON AB 5-0 PS2 18 (SUTURE) ×4 IMPLANT
SUT RELOAD ENDO STITCH 2 48X1 (ENDOMECHANICALS) ×6
SUT RELOAD ENDO STITCH 2.0 (ENDOMECHANICALS) ×4
SUT VIC AB 2-0 SH 27 (SUTURE) ×1
SUT VIC AB 2-0 SH 27X BRD (SUTURE) ×1 IMPLANT
SUTURE RELOAD END STTCH 2 48X1 (ENDOMECHANICALS) ×6 IMPLANT
SUTURE RELOAD ENDO STITCH 2.0 (ENDOMECHANICALS) ×4 IMPLANT
SYR 20CC LL (SYRINGE) ×2 IMPLANT
SYR 50ML LL SCALE MARK (SYRINGE) ×2 IMPLANT
SYR CONTROL 10ML LL (SYRINGE) ×2 IMPLANT
TOWEL OR 17X26 10 PK STRL BLUE (TOWEL DISPOSABLE) ×4 IMPLANT
TOWEL OR NON WOVEN STRL DISP B (DISPOSABLE) ×2 IMPLANT
TRAY FOLEY CATH 14FRSI W/METER (CATHETERS) ×2 IMPLANT
TROCAR XCEL 12X100 BLDLESS (ENDOMECHANICALS) IMPLANT
TROCAR Z-THREAD FIOS 11X100 BL (TROCAR) ×2 IMPLANT
TROCAR Z-THREAD FIOS 12X100MM (TROCAR) ×2 IMPLANT
TROCAR Z-THREAD FIOS 5X100MM (TROCAR) ×2 IMPLANT
TUBING ENDO SMARTCAP (MISCELLANEOUS) ×2 IMPLANT
TUBING FILTER THERMOFLATOR (ELECTROSURGICAL) ×2 IMPLANT
WARMER LAPAROSCOPE (MISCELLANEOUS) IMPLANT
WATER STERILE IRR 1500ML POUR (IV SOLUTION) ×2 IMPLANT

## 2011-12-19 NOTE — Brief Op Note (Signed)
12/19/2011  10:29 AM  PATIENT:  Suzanne Green, 36 y.o., female, MRN: [id number]  PREOP DIAGNOSIS:  morbid obesity   POSTOP DIAGNOSIS:   Morbid obesity, Weight 279, BMI - 47.2, minimal adhesive bands, questionable hernia in lower midline incision.  PROCEDURE:   Procedure(s): LAPAROSCOPIC ROUX-EN-Y GASTRIC Bypass, Upper endoscopy, Enterolysis (10 minutes)  SURGEON:   [name], M.D.  ASSISTANTBiagio Quint, MD  ANESTHESIA:   general  Young Berry Flynn-Cook - CRNA Eilene Ghazi, MD - Anesthesiologist Valeda Malm, CRNA - CRNA  General  EBL:  Minimal   ml  BLOOD ADMINISTERED: none  DRAINS: none   LOCAL MEDICATIONS USED:   42 cc 1/4% marcaine  SPECIMEN:   none  COUNTS CORRECT:  YES  INDICATIONS FOR PROCEDURE:  Suzanne Green is a 36 y.o. (DOB: 04/27/1976) white female whose primary care physician is Marylen Ponto, MD, MD and comes for RYGB for morbid obesity.   The indications and risks of the surgery were explained to the patient.  The risks include, but are not limited to, infection, bleeding, and nerve injury.  Note dictated to:   #[id number]

## 2011-12-19 NOTE — Anesthesia Postprocedure Evaluation (Signed)
  Anesthesia Post-op Note  Patient: Suzanne Green  Procedure(s) Performed: Procedure(s) (LRB): LAPAROSCOPIC ROUX-EN-Y GASTRIC (N/A)  Patient Location: PACU  Anesthesia Type: General  Level of Consciousness: awake and alert   Airway and Oxygen Therapy: Patient Spontanous Breathing  Post-op Pain: mild  Post-op Assessment: Post-op Vital signs reviewed, Patient's Cardiovascular Status Stable, Respiratory Function Stable, Patent Airway and No signs of Nausea or vomiting  Post-op Vital Signs: stable  Complications: No apparent anesthesia complications

## 2011-12-19 NOTE — H&P (View-Only) (Signed)
 Re:   Suzanne Green DOB:   03/13/1976 MRN:   [id number]  ASSESSMENT AND PLAN: 1.  Morbid obesity.  Weight - 279, BMI - 47.2.  She has been through the pre op labs, x-rays, and evaluation.  For RYGB Dec 19, 2011.  The patient is interested in the Roux en Y Gastric Bypass.  I reviewed with the patient the indications and risks of bariatric surgery.  The potential risks of surgery include, but are not limited to, bleeding, infection, leak from the bowel, DVT and PE, open surgery, long term nutrition consequences, and death. We talked about the hospitalization.  The patient understands the importance of compliance and long term follow-up with our group after surgery.  I reviewed her tests with her. Her dad came with her to the office today.  2.  Migraines.  To become new patient of [facility]. 3.  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  Left ankle tendon injury - August 2012 5.  Plantar fasciitis.  Chief Complaint  Patient presents with  . Bariatric Pre-op   REFERRING PHYSICIAN: HOLT,LYNLEY S, MD, MD  HISTORY OF PRESENT ILLNESS: Suzanne Green is a 36 y.o. (DOB: 08/06/1976)  white female whose primary care physician is HOLT,LYNLEY S, MD, MD and comes to me today for pre op visit for bariatric surgery.  She is scheduled for a gastric bypass.  She seems to understand the planned surgery, the hospitalization, post op course and potential complications.  She did not have a lot of questions.  Her dad is with her.  Her husband could not come with her because he works third shift in [place].  She has lost a little bit of weight.  I encouraged he that the more she looses the better.  Her blood work looks good - TSH - 2.167 - 08/29/2011. UGI - 09/05/2011 - Trace of reflux, no HH. US - 09/05/2011 - Moderate steatosis. Dr. Lurey - 10/09/2011 - okay for surgery  She has a significant family history of coronary artery disease and early  death.   Past Medical History  Diagnosis Date  . Migraines     since 1998  . Plantar fasciitis      Past Surgical History  Procedure Date  . Cesarean section 12/26/07, 10/31/04  . Tubal ligation 12/2007  . Abdominal cyst removed 04/2002  . Breath tek h pylori 09/18/2011    Procedure: BREATH TEK H PYLORI;  Surgeon: Tommi Crepeau H Brizza Nathanson, MD;  Location: [facility];  Service: General;  Laterality: N/A;     Current Outpatient Prescriptions  Medication Sig Dispense Refill  . ibuprofen (ADVIL,MOTRIN) 600 MG tablet Take 600 mg by mouth 3 (three) times daily as needed.      . rizatriptan (MAXALT) 10 MG tablet Take 10 mg by mouth as needed. May repeat in 2 hours if needed        No Known Allergies  REVIEW OF SYSTEMS: Skin:  No history of rash.  No history of abnormal moles. Infection:  No history of hepatitis or HIV.  No history of MRSA. Neurologic:  History of migraine headaches well controlled on her current meds.  She is to see [facility].  She has never seen anyone there before and cannot remember the name of the doctor she is to see. Cardiac:  No history of hypertension. No history of heart disease.  No history of prior cardiac catheterization.  No history of seeing a cardiologist. Pulmonary:    Pneumothorax at age 11. Treated with needle. Gets bronchitis easily.  Endocrine:  No diabetes. No thyroid disease. Gastrointestinal:  No history of stomach disease.  No history of liver disease.  No history of gall bladder disease.  No history of pancreas disease.  No history of colon disease. Urologic:  No history of kidney stones.  No history of bladder infections. Musculoskeletal:  Has plantar fasciitis.  Has torn 3 tendons in her right ankle.  Sees a podiatrist. Hematologic:  No bleeding disorder.  No history of anemia.  Not anticoagulated. Psycho-social:  The patient is oriented.   The patient has no obvious psychologic or social impairment to understanding our conversation and  plan.  SOCIAL and FAMILY HISTORY: She works as a nurse at [employer].   Her husband is also a nurse and floats.  I think he works at [employer]. She has three children: 17,6,3.  PHYSICAL EXAM: BP 120/88  Pulse 78  Temp(Src) 97.8 F (36.6 C) (Temporal)  Resp 20  Ht 5' 4.5" (1.638 m)  Wt 272 lb (123.378 kg)  BMI 45.97 kg/m2  General: Obese WF who is alert and generally healthy appearing.  HEENT: Normal. Pupils equal. Good dentition. Neck: Supple. No mass.  No thyroid mass.  Carotid pulse okay with no bruit. Lymph Nodes:  No supraclavicular or cervical nodes. Lungs: Clear to auscultation and symmetric breath sounds. Heart:  RRR. No murmur or rub.  Abdomen: Soft. No mass. No tenderness. No hernia. Normal bowel sounds.  Infraumbilical mid line abdominal scar. Rectal: Guaiac neg stool and no mass. Extremities:  Good strength and ROM  in upper and lower extremities. Neurologic:  Grossly intact to motor and sensory function. Psychiatric: Has normal mood and affect. Behavior is normal.   DATA REVIEWED: Labs and x-rays in HPI.  Sufian Ravi, MD,  [facility] [address]  [address]   [place], [place]    [address] Phone:  [phone] FAX:  [phone]  

## 2011-12-19 NOTE — Interval H&P Note (Signed)
History and Physical Interval Note:  12/19/2011 7:04 AM  Suzanne Green  has presented today for surgery, with the diagnosis of morbid obesity   The various methods of treatment have been discussed with the patient and family. Her husband is here with her today.  After consideration of risks, benefits and other options for treatment, the patient has consented to  Procedure(s) (LRB): LAPAROSCOPIC ROUX-EN-Y GASTRIC (N/A) as a surgical intervention .    The patients' history has been reviewed, patient examined, no change in status, stable for surgery.  I have reviewed the patients' chart and labs.  Questions were answered to the patient's satisfaction.     Jennell Janosik H

## 2011-12-19 NOTE — Transfer of Care (Signed)
Immediate Anesthesia Transfer of Care Note  Patient: Suzanne Green  Procedure(s) Performed: Procedure(s) (LRB): LAPAROSCOPIC ROUX-EN-Y GASTRIC (N/A)  Patient Location: PACU  Anesthesia Type: General  Level of Consciousness: oriented, sedated and patient cooperative  Airway & Oxygen Therapy: Patient Spontanous Breathing and Patient connected to face mask oxygen  Post-op Assessment: Report given to PACU RN, Post -op Vital signs reviewed and stable and Patient moving all extremities  Post vital signs: Reviewed and stable  Complications: No apparent anesthesia complications

## 2011-12-19 NOTE — Progress Notes (Signed)
NOS Room 1534  Awake, nauseated earlier, better now. Sats - 100%,  Pulse - 74 Abdomen - Incisions okay. Husband in room.  [name], MD, [facility] Pager: [phone] Office phone:  [phone]

## 2011-12-20 ENCOUNTER — Inpatient Hospital Stay (HOSPITAL_COMMUNITY): Payer: 59

## 2011-12-20 DIAGNOSIS — Z01810 Encounter for preprocedural cardiovascular examination: Secondary | ICD-10-CM

## 2011-12-20 LAB — DIFFERENTIAL
Basophils Relative: 0 % (ref 0–1)
Monocytes Relative: 9 % (ref 3–12)
Neutro Abs: 5.5 10*3/uL (ref 1.7–7.7)
Neutrophils Relative %: 61 % (ref 43–77)

## 2011-12-20 LAB — CBC
Hemoglobin: 10.4 g/dL — ABNORMAL LOW (ref 12.0–15.0)
MCHC: 32.2 g/dL (ref 30.0–36.0)
Platelets: 251 10*3/uL (ref 150–400)
RBC: 3.67 MIL/uL — ABNORMAL LOW (ref 3.87–5.11)

## 2011-12-20 LAB — HEMOGLOBIN AND HEMATOCRIT, BLOOD
HCT: 31 % — ABNORMAL LOW (ref 36.0–46.0)
Hemoglobin: 10.1 g/dL — ABNORMAL LOW (ref 12.0–15.0)

## 2011-12-20 MED ORDER — IOHEXOL 300 MG/ML  SOLN
50.0000 mL | Freq: Once | INTRAMUSCULAR | Status: AC | PRN
  Administered 2011-12-20: 50 mL via ORAL

## 2011-12-20 NOTE — Progress Notes (Signed)
UR complete 

## 2011-12-20 NOTE — Progress Notes (Signed)
Bilateral lower extremity venous duplex completed.  Preliminary report is negative for DVT, SVT, or a Baker's cyst. 

## 2011-12-20 NOTE — Progress Notes (Signed)
General Surgery Note  LOS: 1 day  POD# 1 Room - 1534  Assessment/Plan: 1.  LAPAROSCOPIC ROUX-EN-Y GASTRIC Bypass - 12/19/2011 - D. Jazsmine Macari  Doing well so far.  For swallow later this AM.  2. Migraines.   To become new patient of [facility].  3. 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  Had a good night. Objective:   Filed Vitals:   12/20/11 0621  BP: 107/67  Pulse: 70  Temp: 97.8 F (36.6 C)  Resp: 16     Intake/Output from previous day:  05/14 0701 - 05/15 0700 In: 2750 [I.V.:2700] Out: 1825 [Urine:1775; Blood:50]  Intake/Output this shift:      Physical Exam:   General: WN obese WF who is alert and oriented.    HEENT: Normal. Pupils equal. .   Lungs: clear    Abdomen: Soft, rare BS.   Wound: Look good   Neurologic:  Grossly intact to motor and sensory function.   Psychiatric: Has normal mood and affect. Behavior is normal   Lab Results:    Basename 12/20/11 0700 12/19/11 1312  WBC 9.1 --  HGB 10.4* 11.3*  HCT 32.3* 33.7*  PLT 251 --    BMET  No results found for this basename: NA:2,K:2,CL:2,CO2:2,GLUCOSE:2,BUN:2,CREATININE:2,CALCIUM:2 in the last 72 hours  PT/INR  No results found for this basename: LABPROT:2,INR:2 in the last 72 hours  ABG  No results found for this basename: PHART:2,PCO2:2,PO2:2,HCO3:2 in the last 72 hours   Studies/Results:  No results found.   Anti-infectives:   Anti-infectives     Start     Dose/Rate Route Frequency Ordered Stop   12/19/11 0531   cefOXitin (MEFOXIN) 2 g in dextrose 5 % 50 mL IVPB        2 g 100 mL/hr over 30 Minutes Intravenous 60 min pre-op 12/19/11 0531 12/19/11 0722          Suzanne Kin, MD, FACS Pager: [phone],   [facility] Office: [phone] 12/20/2011

## 2011-12-20 NOTE — Progress Notes (Signed)
UGI results called to Dr. Ezzard Standing; orders received to advance diet to POD #1 diet; [place], RN aware of orders and results. Talmadge Chad, RN Bariatric Nurse  Coordinator

## 2011-12-20 NOTE — Progress Notes (Signed)
Pt alert and oriented; husband at bedside; VSS; denies nausea or vomiting; NPO; awaiting UGI; doppler study negative; c/o some abdominal soreness with relief from prn pain meds; denies flatus or BM; voiding without difficulty; ambulating without difficulty; pt already has follow up appts with [facility] and [facility]; aware of BELT program and support group; discharge instructions given for pt to review.  GASTRIC BYPASS/SLEEVE DISCHARGE INSTRUCTIONS  Drs. Fredrik Rigger, [place], [place], and [place] Call if you have any problems.   Call [phone] and ask for the surgeon on call.    If you need immediate assistance come to the ER at [facility]. Tell the ER personnel that you are a new post-op gastric bypass patient. Signs and symptoms to report:   Severe vomiting or nausea. If you cannot tolerate clear liquids for longer than 1 day, you need to call your surgeon.    Abdominal pain which does not get better after taking your pain medication   Fever greater than 101 F degree   Difficulty breathing   Chest pain    Redness, swelling, drainage, or foul odor at incision sites    If your incisions open or pull apart   Swelling or pain in calf (lower leg)   Diarrhea, frequent watery, uncontrolled bowel movements.   Constipation, (no bowel movements for 3 days) if this occurs, Take Milk of Magnesia, 2 tablespoons by mouth, 3 times a day for 2 days if needed.  Call your doctor if constipation continues. Stop taking Milk of Magnesia once you have had a bowel movement. You may also use Miralax according to the label instructions.   Anything you consider "abnormal for you".   Normal side effects after Surgery:   Unable to sleep at night or concentrate   Irritability   Being tearful (crying) or depressed   These are common complaints, possibly related to your anesthesia, stress of surgery and change in lifestyle, that usually go away a few weeks after surgery.  If these feelings continue, call your medical  doctor.  Wound Care You may have surgical glue, steri-strips, or staples over your incisions after surgery.  Surgical glue:  Looks like a clear film over your incisions and will wear off gradually. Steri-strips: Strips of tape over your incisions. You may notice a yellowish color on the skin underneath the steri-strips. This is a substance used to make the steri-strips stick better. Do not pull the steri-strips off - let them fall off.  Staples: Cherlynn Polo may be removed before you leave the hospital. If you go home with staples, call [facility] [phone]) for an appointment with your surgeon's nurse to have staples removed in 7 - 10 days. Showering: You may shower two days after your surgery unless otherwise instructed by your surgeon. Wash gently around wounds with warm soapy water, rinse well, and gently pat dry.  If you have a drain, you may need someone to hold this while you shower. Avoid tub baths until staples are removed and incisions are healed.    Medications   Medications should be liquid or crushed if larger than the size of a dime.  Extended release pills should not be crushed.   Depending on the size and number of medications you take, you may need to stagger/change the time you take your medications so that you do not over-fill your pouch.    Make sure you follow-up with your primary care physician to make medication adjustments needed during rapid weight loss and life-style adjustment.  If you are diabetic, follow up with the doctor that prescribes your diabetes medication(s) within one week after surgery and check your blood sugar regularly.   Do not drive while taking narcotics!   Do not take acetaminophen (Tylenol) and Roxicet or Lortab Elixir at the same time since these pain medications contain acetaminophen.  Diet at home: (First 2 Weeks) You will see the nutritionist two weeks after your surgery. She will advance your diet if you are tolerating liquids  well. Once at home, if you have severe vomiting or nausea and cannot tolerate clear liquids lasting longer than 1 day, call your surgeon.  Begin high protein shake 2 ounces every 3 hours, 5 - 6 times per day.  Gradually increase the amount you drink as tolerated.  You may find it easier to slowly sip shakes throughout the day.  It is important to get your proteins in first.   Protein Shake   Drink at least 2 ounces of shake 5-6 times per day   Each serving of protein shakes should have a minimum of 15 grams of protein and no more than 5 grams of carbohydrate    Increase the amount of protein shake you drink as tolerated   Protein powder may be added to fluids such as non-fat milk or Lactaid milk (limit to 20 grams added protein powder per serving   The initial goal is to drink at least 8 ounces of protein shake/drink per day (or as directed by the nutritionist). Some examples of protein shakes are [employer], [employer], EAS Edge HP, and Unjury. Hydration   Gradually increase the amount of water and other liquids as tolerated (See Acceptable Fluids)   Gradually increase the amount of protein shake as tolerated     Sip fluids slowly and throughout the day   May use Sugar substitutes, use sparingly (limit to 6 - 8 packets per day). Your fluid goal is 64 ounces of fluid daily. It may take a few weeks to build up to this.         32 oz (or more) should be clear liquids and 32 oz (or more) should be full liquids.         Liquids should not contain sugar, caffeine, or carbonation! Acceptable Fluids Clear Liquids:   Water or Sugar-free flavored water, Fruit H2O   Decaffeinated coffee or tea (sugar-free)   Crystal Lite, Wyler's Lite, Minute Maid Lite   Sugar-free Jell-O   Bouillon or broth   Sugar-free Popsicle:   *Less than 20 calories each; Limit 1 per day   Full Liquids:              Protein Shakes/Drinks + 2 choices per day of other full liquids shown below.    Other full liquids  must be: No more than 12 grams of Carbs per serving,  No more than 3 grams of Fat per serving   Strained low-fat cream soup   Non-Fat milk   Fat-free Lactaid Milk   Sugar-free yogurt (Dannon Lite & Fit) Vitamins and Minerals (Start 1 day after surgery unless otherwise directed)   2 Chewable Multivitamin / Multimineral Supplement (i.e. Centrum for Adults)   Chewable Calcium Citrate with Vitamin D-3. Take 1500 mg each day.           (Example: 3 Chewable Calcium Plus 600 with Vitamin D-3 can be found at [facility])         Vitamin B-12, 350 - 500 micrograms (oral tablet) each day  Do not mix multivitamins containing iron with calcium supplements; take 2 hours   apart   Do not substitute Tums (calcium carbonate) for your calcium   Menstruating women and those at risk for anemia may need extra iron. Talk with your doctor to see if you need additional iron.    If you need extra iron:  Total daily Iron recommendations (including Vitamins) = 50 - 100 mg Iron/day Do not stop taking or change any vitamins or minerals until you talk to your nutritionist or surgeon. Your nutritionist and / or physician must approve all vitamin and mineral supplements. Exercise For maximum success, begin exercising as soon as your doctor recommends. Make sure your physician approves any physical activity.   Depending on fitness level, begin with a simple walking program   Walk 5-15 minutes each day, 7 days per week.    Slowly increase until you are walking 30-45 minutes per day   Consider joining our BELT program. [phone] or email belt@[employer] .edu Things to remember:    You may have sexual relations when you feel comfortable. It is VERY important for female patients to use a reliable birth control method. Fertility often increases after surgery. Do not get pregnant for at least 18 months.   It is very important to keep all follow up appointments with your surgeon, nutritionist, primary care physician, and behavioral  health practitioner. After the first year, please follow up with your bariatric surgeon at least once a year in order to maintain best weight loss results.  [facility]: [phone] [facility]: [phone]   Free counseling is available for you and your family through collaboration between [facility] and [place]. Please call [phone] and leave a message.    Consider purchasing a medical alert bracelet that says you had gastric bypass surgery.    [facility] has a free Bariatric Surgery Support Group that meets monthly, the 3rd Thursday, 6 pm, Classroom #1, [place]. You may register online at [email or web address], but registration is not necessary. Select Classes and Support Groups, Bariatric Surgery, or Call [phone]   Do not return to work or drive until cleared by your surgeon   Use your CPAP when sleeping if applicable   Do not lift anything greater than ten pounds for at least two weeks  Talmadge Chad, RN Bariatric Nurse Coordinator

## 2011-12-21 ENCOUNTER — Encounter (HOSPITAL_COMMUNITY): Payer: Self-pay | Admitting: Surgery

## 2011-12-21 LAB — DIFFERENTIAL
Basophils Absolute: 0.1 10*3/uL (ref 0.0–0.1)
Basophils Relative: 1 % (ref 0–1)
Lymphocytes Relative: 41 % (ref 12–46)
Monocytes Absolute: 0.7 10*3/uL (ref 0.1–1.0)
Neutro Abs: 3 10*3/uL (ref 1.7–7.7)
Neutrophils Relative %: 45 % (ref 43–77)

## 2011-12-21 LAB — CBC
HCT: 31.6 % — ABNORMAL LOW (ref 36.0–46.0)
MCHC: 32 g/dL (ref 30.0–36.0)
Platelets: 219 10*3/uL (ref 150–400)
RDW: 14 % (ref 11.5–15.5)
WBC: 6.7 10*3/uL (ref 4.0–10.5)

## 2011-12-21 MED ORDER — OXYCODONE-ACETAMINOPHEN 5-325 MG/5ML PO SOLN: 5.0000 mL | ORAL | Status: DC | PRN

## 2011-12-21 NOTE — Discharge Instructions (Signed)
[facility] - DISCHARGE INSTRUCTIONS TO PATIENT  Return to work on:  Approximately one month  Activity:  Driving - May drive in 2 or 3 days if doing well   Lifting - No heavy lifting (>15 pounds) for one week  Wound Care:   May shower  Diet:  Post op bariatric diet  Follow up appointment:  Has an appointment with Dr. Allene Pyo office [facility]).  Call  [phone] if question.  Medications and dosages:  Resume your home medications.  You have a prescription for:  Roxicet  Call Dr. Ezzard Standing or his office  [phone]) if you have:  Temperature greater than 100.4,  Persistent nausea and vomiting,  Severe uncontrolled pain,  Redness, tenderness, or signs of infection (pain, swelling, redness, odor or green/yellow discharge around the site),  Difficulty breathing, headache or visual disturbances,  Any other questions or concerns you may have after discharge.  In an emergency, call 911 or go to an Emergency Department at a nearby hospital.

## 2011-12-21 NOTE — Op Note (Signed)
Suzanne Green, Suzanne Green NO.:  [id number]  MEDICAL RECORD NO.:  [id number]  LOCATION:  1534                         FACILITY:  [facility]  PHYSICIAN:  Sandria Bales. Ezzard Standing, M.D.  DATE OF BIRTH:  1976/02/24  DATE OF PROCEDURE:  12/19/2011                              OPERATIVE REPORT  PREOPERATIVE DIAGNOSIS:  Morbid obesity, weight 279, body mass index of 47.2.  POSTOPERATIVE DIAGNOSIS:  Morbid obesity, weight 279, body mass index of 47.2; minimal adhesive band; questionable hernia in the lower midline incision.  PROCEDURES:  Laparoscopic Roux-en-Y gastric bypass, upper endoscopy, enterolysis of adhesions for 10 minutes.  SURGEON:  Sandria Bales. Ezzard Standing, M.D.  FIRST ASSISTANT:  Lodema Pilot, MD.  ANESTHESIA:  General.  ESTIMATED BLOOD LOSS:  Minimal.  INDICATION FOR PROCEDURE:  Suzanne Green is a 36 year old white female who sees Dr. Juleen China, who is her primary medical doctor, comes for a Roux-en-Y gastric bypass for morbid obesity.  The indications and potential complications of the surgery were explained to the patient.  Potential complications include, but are not limited to, bleeding, infection, leak from the bowel, DVT and PE, and possible open surgery.  OPERATIVE NOTE:  The patient was taken to the operating room #1, where she underwent a general endotracheal anesthesia supervised by Dr. Eilene Ghazi.  She was given IV antibiotics and PAS stockings in place.  Her abdomen was prepped and sterilely draped.  A time-out was held and surgical checklist run.  I accessed her abdominal cavity through the left upper quadrant using an 11-mm Optiview.  I placed six additional trocars of 5 mm in subxiphoid location for the liver retractor, a 12-mm trocar in the right mid subcostal, a 12-mm trocar at right paramedian, and 11-mm  trocar at right and below the umbilicus, a 12-mm trocar at left paramedian, and a 5-mm trocar at the left lateral subcostal area.  Abdominal  exploration revealed right and left lobes of liver unremarkable.  Stomach was unremarkable.  She did have some adhesive bands to her pelvis.  There were no adhesions into the midline of her abdomen.  These were taken down with Harmonic Scalpel.  She had what looked like a potential fascial defect in her lower midline incision.  It was hard for me to tell whether this was fat or potential hernia defect in her lower midline incision, and then she had adhesive bands in the right colon towards the pelvis, and the omentum down towards the pelvis.  I spent about 10 to 15 minutes taking these adhesions down.  I then identified the ligament of Treitz around the small Bowel, over halfway to the small bowel.  I was worried because of the prior history of retroperitoneal surgery of having significant small bowel adhesions, but she did not have these adhesions.  I thought it was reasonable to go ahead with the bypass surgery.  So I found the ligament of Treitz, counted 40 cm, divided the jejunum with a white load of the 45 mm Ethicon Endo-GIA stapler.  I divided the mesentery.  I then lined up and did a side-to-side jejunal to jejunal anastomosis.  Using the 45-mm white stapler load, I closed the enterotomy with two running  2-0 Vicryl sutures.  I placed Tisseel at the anastomosis and closed the mesenteric defect with a running 2-0 silk suture.  I counted 100 cm for the future gastric limb.  This was where the jejunal-jejunal anastomosis was made.  I placed a Penrose drain on the future gastric limb for orientation purposes.  I then divided the omentum with the Harmonic Scalpel and to the transverse colon.  She had a moderate amount of omentum.  I then placed a liver retractor in the left lobe of the liver and elevated the liver.  She had a very small dimple consistent with probably a very small early hiatal hernia.  I opened up the window along the left crus and the left esophagogastric junction.   I then counted 5 cm down into the lesser sac.  I divided the stomach first with a 45-mm blue-load of the Ethicon Endo-GIA stapler and then 3 loads of the 60 blue load to create a pouch approximately 5 cm long and 3 cm in width. The pouch staple line looked good.  I then oversewed the gastric remnant staple line with a locking 2-0 Vicryl suture with lap tape on both ends.  I then brought the jejunum antecolic, antegastric and did a gastrojejunal anastomosis with a posterior running 2-0 Vicryl suture.  I then made an enterotomy into the stomach, enterotomy into the small bowel, did a gastrojejunal anastomosis with a 45-mm blue load staples. I tried to leave the anastomosis about 2-2.5 cm.  I then closed the enterotomy with 2 running 2-0 Vicryl sutures and then did an anterior running suture of 2-0 Vicryl sutures.  I then closed Petersen defect with a figure-of-eight 2-0 silk suture, bringing the mesentery of the transverse colon to the mesentery of the distal cut jejunum.  At this point, Dr. Biagio Quint broke scrub.  He went up and did upper endoscopy.  I passed the endoscope down to the esophagus to the stomach pouch, and he identified the anastomosis, which looked good.  The stomach itself was viable.  Anastomosis was about 44 cm, the esophagogastric junction about 39 cm for 5 cm pouch.  I clamped off the jejunum, placed the anastomosis under water.  There was no evidence of leak or bubbling. The endoscope was then removed.  I then placed Tisseel over the gastrojejunal anastomosis.  I removed the trocars in turn.  There was no bleeding at any trocar site.  The skin at each site was closed with a 5- 0 Monocryl suture, painted with Dermabond.    The patient tolerated the procedure well, and was transferred to recovery room in good condition.  Sponge and needle count were correct at end of the case.  I used a total about 42 cc of local anesthesia.   Sandria Bales. Ezzard Standing, M.D., FACS   DHN/MEDQ  D:   12/19/2011  T:  12/21/2011  Job:  [id number]  cc:   Dr. Juleen China

## 2011-12-21 NOTE — Op Note (Signed)
NAMETEMIA, DEBROUX NO.:  [id number]  MEDICAL RECORD NO.:  [id number]  LOCATION:  1534                         FACILITY:  [facility]  PHYSICIAN:  Lodema Pilot, MD       DATE OF BIRTH:  [birth year]  DATE OF PROCEDURE:  12/19/2011 DATE OF DISCHARGE:  12/21/2011                              OPERATIVE REPORT   PROCEDURE:  Intraoperative esophagogastrojejunostomy.  PREOPERATIVE DIAGNOSIS:  Morbid obesity.  POSTOPERATIVE DIAGNOSIS:  Morbid obesity.  SURGEON:  Lodema Pilot, MD.  ASSISTANT:  Sandria Bales. Ezzard Standing, M.D.  FLUIDS:  Please see anesthesia records.  ESTIMATED BLOOD LOSS:  None.  COMPLICATIONS:  None apparent.  INDICATION OF PROCEDURE:  Ms. Hopson is a 36 year old female undergoing Roux-en-Y gastric bypass for morbid obesity, need of intraoperative endoscopy for leak testing to evaluate the gastric pouch.  OPERATIVE DETAILS:  Ms. Hardt was already mid procedure with her laparoscopic Roux-en-Y gastric bypass with Dr. Ezzard Standing, and after the gastrojejunal anastomosis was created, he has performed intraoperative endoscopy to evaluate the pouch for intraoperative leak testing.  A well lubricated fiberoptic endoscope was passed without difficulty into the esophagus and the esophagus was without any masses or evidence of esophagitis.  GE junction was located at 39 cm from the incisors.  The scope was introduced in the newly created gastric pouch.  The internal staple line appeared adequate without any evidence of bleeding and the anastomosis appeared patent.  The anastomosis was located at 44 cm from the incisors for a total of approximately 5 cm length of the gastric pouch.  Air was insufflated into the stomach and small bowel after he had clamped the small intestine Roux limb, and there was no evidence of air leakage.  The scope was driven into the jejunal Roux limb, and a small bowel mucosa appeared normal, and the anastomosis was patent.  The excess air was  suctioned and the scope was slowly removed.  The patient tolerated the procedure well, and there were no apparent complications.          ______________________________ Lodema Pilot, MD     BL/MEDQ  D:  12/19/2011  T:  12/21/2011  Job:  [id number]

## 2011-12-21 NOTE — Progress Notes (Signed)
Pt alert and oriented; VSS; pt verbalized ready to go home; tolerating water well; will advance to protein shakes and if tolerates well will discharge home; denies any nausea or vomiting; burping but denies flatus; no BM; voiding well; ambulating in hallways without difficulty; pt already has follow up appts with [facility] and [facility]; aware of support group and BELT program; discharge instructions reviewed with pt and husband and they verbalized understanding of.  GASTRIC BYPASS/SLEEVE DISCHARGE INSTRUCTIONS  Drs. Fredrik Rigger, Hoxworth, Wilson, and Mason Call if you have any problems.   Call [phone] and ask for the surgeon on call.    If you need immediate assistance come to the ER at [facility]. Tell the ER personnel that you are a new post-op gastric bypass patient. Signs and symptoms to report:   Severe vomiting or nausea. If you cannot tolerate clear liquids for longer than 1 day, you need to call your surgeon.    Abdominal pain which does not get better after taking your pain medication   Fever greater than 101 F degree   Difficulty breathing   Chest pain    Redness, swelling, drainage, or foul odor at incision sites    If your incisions open or pull apart   Swelling or pain in calf (lower leg)   Diarrhea, frequent watery, uncontrolled bowel movements.   Constipation, (no bowel movements for 3 days) if this occurs, Take Milk of Magnesia, 2 tablespoons by mouth, 3 times a day for 2 days if needed.  Call your doctor if constipation continues. Stop taking Milk of Magnesia once you have had a bowel movement. You may also use Miralax according to the label instructions.   Anything you consider "abnormal for you".   Normal side effects after Surgery:   Unable to sleep at night or concentrate   Irritability   Being tearful (crying) or depressed   These are common complaints, possibly related to your anesthesia, stress of surgery and change in lifestyle, that usually go away a few weeks after  surgery.  If these feelings continue, call your medical doctor.  Wound Care You may have surgical glue, steri-strips, or staples over your incisions after surgery.  Surgical glue:  Looks like a clear film over your incisions and will wear off gradually. Steri-strips: Strips of tape over your incisions. You may notice a yellowish color on the skin underneath the steri-strips. This is a substance used to make the steri-strips stick better. Do not pull the steri-strips off - let them fall off.  Staples: Cherlynn Polo may be removed before you leave the hospital. If you go home with staples, call [facility] [phone]) for an appointment with your surgeon's nurse to have staples removed in 7 - 10 days. Showering: You may shower two days after your surgery unless otherwise instructed by your surgeon. Wash gently around wounds with warm soapy water, rinse well, and gently pat dry.  If you have a drain, you may need someone to hold this while you shower. Avoid tub baths until staples are removed and incisions are healed.    Medications   Medications should be liquid or crushed if larger than the size of a dime.  Extended release pills should not be crushed.   Depending on the size and number of medications you take, you may need to stagger/change the time you take your medications so that you do not over-fill your pouch.    Make sure you follow-up with your primary care physician to make medication  adjustments needed during rapid weight loss and life-style adjustment.   If you are diabetic, follow up with the doctor that prescribes your diabetes medication(s) within one week after surgery and check your blood sugar regularly.   Do not drive while taking narcotics!   Do not take acetaminophen (Tylenol) and Roxicet or Lortab Elixir at the same time since these pain medications contain acetaminophen.  Diet at home: (First 2 Weeks) You will see the nutritionist two weeks after your surgery. She  will advance your diet if you are tolerating liquids well. Once at home, if you have severe vomiting or nausea and cannot tolerate clear liquids lasting longer than 1 day, call your surgeon.  Begin high protein shake 2 ounces every 3 hours, 5 - 6 times per day.  Gradually increase the amount you drink as tolerated.  You may find it easier to slowly sip shakes throughout the day.  It is important to get your proteins in first.   Protein Shake   Drink at least 2 ounces of shake 5-6 times per day   Each serving of protein shakes should have a minimum of 15 grams of protein and no more than 5 grams of carbohydrate    Increase the amount of protein shake you drink as tolerated   Protein powder may be added to fluids such as non-fat milk or Lactaid milk (limit to 20 grams added protein powder per serving   The initial goal is to drink at least 8 ounces of protein shake/drink per day (or as directed by the nutritionist). Some examples of protein shakes are [employer], Dillard's, EAS Edge HP, and Unjury. Hydration   Gradually increase the amount of water and other liquids as tolerated (See Acceptable Fluids)   Gradually increase the amount of protein shake as tolerated     Sip fluids slowly and throughout the day   May use Sugar substitutes, use sparingly (limit to 6 - 8 packets per day). Your fluid goal is 64 ounces of fluid daily. It may take a few weeks to build up to this.         32 oz (or more) should be clear liquids and 32 oz (or more) should be full liquids.         Liquids should not contain sugar, caffeine, or carbonation! Acceptable Fluids Clear Liquids:   Water or Sugar-free flavored water, Fruit H2O   Decaffeinated coffee or tea (sugar-free)   Crystal Lite, Wyler's Lite, Minute Maid Lite   Sugar-free Jell-O   Bouillon or broth   Sugar-free Popsicle:   *Less than 20 calories each; Limit 1 per day   Full Liquids:              Protein Shakes/Drinks + 2 choices per day of  other full liquids shown below.    Other full liquids must be: No more than 12 grams of Carbs per serving,  No more than 3 grams of Fat per serving   Strained low-fat cream soup   Non-Fat milk   Fat-free Lactaid Milk   Sugar-free yogurt (Dannon Lite & Fit) Vitamins and Minerals (Start 1 day after surgery unless otherwise directed)   2 Chewable Multivitamin / Multimineral Supplement (i.e. Centrum for Adults)   Chewable Calcium Citrate with Vitamin D-3. Take 1500 mg each day.           (Example: 3 Chewable Calcium Plus 600 with Vitamin D-3 can be found at [facility])  Vitamin B-12, 350 - 500 micrograms (oral tablet) each day   Do not mix multivitamins containing iron with calcium supplements; take 2 hours   apart   Do not substitute Tums (calcium carbonate) for your calcium   Menstruating women and those at risk for anemia may need extra iron. Talk with your doctor to see if you need additional iron.    If you need extra iron:  Total daily Iron recommendations (including Vitamins) = 50 - 100 mg Iron/day Do not stop taking or change any vitamins or minerals until you talk to your nutritionist or surgeon. Your nutritionist and / or physician must approve all vitamin and mineral supplements. Exercise For maximum success, begin exercising as soon as your doctor recommends. Make sure your physician approves any physical activity.   Depending on fitness level, begin with a simple walking program   Walk 5-15 minutes each day, 7 days per week.    Slowly increase until you are walking 30-45 minutes per day   Consider joining our BELT program. [phone] or email [email or web address] [email or web address] Things to remember:    You may have sexual relations when you feel comfortable. It is VERY important for female patients to use a reliable birth control method. Fertility often increases after surgery. Do not get pregnant for at least 18 months.   It is very important to keep all follow up appointments with your surgeon,  nutritionist, primary care physician, and behavioral health practitioner. After the first year, please follow up with your bariatric surgeon at least once a year in order to maintain best weight loss results.  [facility]: [phone] [facility]: [phone]   Free counseling is available for you and your family through collaboration between [facility] and [place]. Please call [phone] and leave a message.    Consider purchasing a medical alert bracelet that says you had gastric bypass surgery.    [facility] has a free Bariatric Surgery Support Group that meets monthly, the 3rd Thursday, 6 pm, Classroom #1, [place]. You may register online at [email or web address], but registration is not necessary. Select Classes and Support Groups, Bariatric Surgery, or Call [phone]   Do not return to work or drive until cleared by your surgeon   Use your CPAP when sleeping if applicable   Do not lift anything greater than ten pounds for at least two weeks  Talmadge Chad, RN Bariatric Nurse Coordinator

## 2011-12-21 NOTE — Progress Notes (Signed)
General Surgery Note  LOS: 2 days  POD# 2 Room - 1534  Assessment/Plan: 1.  LAPAROSCOPIC ROUX-EN-Y GASTRIC Bypass - 12/19/2011 - Suzanne Green was okay.  On water.   Slept well.   To advance to protein drinks.  If tolerated, will go home today.  I reviewed the discharge instructions.  2. Migraines.   To become new patient of [facility].  3. Prior abdominal surgery for retroperitoneal tumor - 2003. - I found minimal adhesions.  Subjective:  Doing well.  Had a good night.  Husband in room. Objective:   Filed Vitals:   12/21/11 0555  BP: 110/70  Pulse: 71  Temp: 98 F (36.7 C)  Resp: 18     Intake/Output from previous day:  05/15 0701 - 05/16 0700 In: 5867.5 [P.O.:1000; I.V.:4867.5] Out: 2200 [Urine:2200]  Intake/Output this shift:      Physical Exam:   General: WN obese WF who is alert and oriented.    HEENT: Normal. Pupils equal. .   Lungs: Clear    Abdomen: Soft, BS present.   Wound: Look good.   Neurologic:  Grossly intact to motor and sensory function.   Psychiatric: Has normal mood and affect. Behavior is normal   Lab Results:     Basename 12/21/11 0431 12/20/11 1801 12/20/11 0700  WBC 6.7 -- 9.1  HGB 10.1* 10.1* --  HCT 31.6* 31.0* --  PLT 219 -- 251    BMET  No results found for this basename: NA:2,K:2,CL:2,CO2:2,GLUCOSE:2,BUN:2,CREATININE:2,CALCIUM:2 in the last 72 hours  PT/INR  No results found for this basename: LABPROT:2,INR:2 in the last 72 hours  ABG  No results found for this basename: PHART:2,PCO2:2,PO2:2,HCO3:2 in the last 72 hours   Studies/Results:  Dg Ugi W/water Sol Cm  12/20/2011  *RADIOLOGY REPORT*  Clinical Data: Status post gastric bypass.  ESOPHOGRAM/BARIUM SWALLOW  Technique:  Single contrast examination was performed using water soluble.  Fluoroscopy time:  1.3 minutes.  Comparison:  Upper GI 09/05/2011.  Findings:  Gastric pouch appears normal.  No leakage of contrast material is seen at the proximal or distal  anastomosis.  Contrast flows into small bowel which is mildly dilated.  Scout film is unremarkable.  IMPRESSION: Status post gastric bypass.  No complicating feature.  Original Report Authenticated By: Bernadene Bell. [name], M.D.     Anti-infectives:   Anti-infectives     Start     Dose/Rate Route Frequency Ordered Stop   12/19/11 0531   cefOXitin (MEFOXIN) 2 g in dextrose 5 % 50 mL IVPB        2 g 100 mL/hr over 30 Minutes Intravenous 60 min pre-op 12/19/11 0531 12/19/11 0722          [name], MD, FACS Pager: [phone],   [facility] Office: [phone] 12/21/2011

## 2011-12-25 ENCOUNTER — Telehealth (INDEPENDENT_AMBULATORY_CARE_PROVIDER_SITE_OTHER): Payer: Self-pay

## 2011-12-25 NOTE — Telephone Encounter (Signed)
Pt calling to c/o severe nausea.  She cannot even get liquids down.  Dr. Ezzard Standing is out of the office.  Please advise.

## 2011-12-25 NOTE — Telephone Encounter (Signed)
Pt made aware Phenergan 12.5mg  #12 q 4 hrs w/ 1 refill was called to [facility].  She was also instructed to call our office if her symptoms worsened, i.e., vomiting and increased pain.  She agreed.

## 2011-12-26 ENCOUNTER — Telehealth (INDEPENDENT_AMBULATORY_CARE_PROVIDER_SITE_OTHER): Payer: Self-pay | Admitting: General Surgery

## 2011-12-26 NOTE — Telephone Encounter (Signed)
She underwent a laparoscopic gastric bypass about a week ago. She's been having problems with nausea but was given Phenergan and that has helped. However she's been having problems with diarrhea is passing some mucus and little but of blood with it. She's been taking her protein shakes. I instructed her to only drink clear liquids for now and try some Kaopectate. If the diarrhea persists, instructed to call the office as she may need a stool studies sent for C. Difficile.

## 2011-12-27 ENCOUNTER — Telehealth (INDEPENDENT_AMBULATORY_CARE_PROVIDER_SITE_OTHER): Payer: Self-pay

## 2011-12-27 NOTE — Telephone Encounter (Signed)
I called to check on the pt.  She started having diarrhea on Sunday.  She was going 3-4 x / hr last night until 2am.  Since 7am it has only been twice.  She got her period Sunday and thought that may be what caused it.  She spoke to Dr Abbey Chatters last night who advised Kaopectate and that slowed it down some.  She had a temp of 99.6 last night.  She has been very nauseated and was given Phenergen Monday.  She has not vomited.  She noticed bright red flecks of blood and mucous in her stool.  I spoke to Dr Ezzard Standing who feels she may have the gi bug and wants her to stay on liquids.  Let us know next week if it still persists.  I notified the pt.  I told her to stay hydrated.

## 2011-12-28 ENCOUNTER — Encounter (HOSPITAL_COMMUNITY): Payer: Self-pay | Admitting: Emergency Medicine

## 2011-12-28 ENCOUNTER — Emergency Department (HOSPITAL_COMMUNITY): Payer: 59

## 2011-12-28 ENCOUNTER — Inpatient Hospital Stay (HOSPITAL_COMMUNITY)
Admission: EM | Admit: 2011-12-28 | Discharge: 2011-12-31 | DRG: 372 | Disposition: A | Discharge status: Home / Self Care | Payer: 59 | Attending: Surgery | Admitting: Surgery

## 2011-12-28 DIAGNOSIS — Z6841 Body Mass Index (BMI) 40.0 and over, adult: Secondary | ICD-10-CM

## 2011-12-28 DIAGNOSIS — R109 Unspecified abdominal pain: Secondary | ICD-10-CM | POA: Diagnosis present

## 2011-12-28 DIAGNOSIS — E876 Hypokalemia: Secondary | ICD-10-CM | POA: Diagnosis present

## 2011-12-28 DIAGNOSIS — Z9884 Bariatric surgery status: Secondary | ICD-10-CM

## 2011-12-28 DIAGNOSIS — K5289 Other specified noninfective gastroenteritis and colitis: Secondary | ICD-10-CM

## 2011-12-28 DIAGNOSIS — A0472 Enterocolitis due to Clostridium difficile, not specified as recurrent: Principal | ICD-10-CM | POA: Diagnosis present

## 2011-12-28 LAB — DIFFERENTIAL
Eosinophils Absolute: 0.3 10*3/uL (ref 0.0–0.7)
Eosinophils Relative: 3 % (ref 0–5)
Lymphocytes Relative: 19 % (ref 12–46)
Lymphs Abs: 1.8 10*3/uL (ref 0.7–4.0)
Monocytes Relative: 7 % (ref 3–12)
Neutrophils Relative %: 71 % (ref 43–77)

## 2011-12-28 LAB — CBC
Hemoglobin: 12.9 g/dL (ref 12.0–15.0)
MCH: 28.2 pg (ref 26.0–34.0)
MCV: 84.9 fL (ref 78.0–100.0)
RBC: 4.58 MIL/uL (ref 3.87–5.11)

## 2011-12-28 LAB — COMPREHENSIVE METABOLIC PANEL
Alkaline Phosphatase: 64 U/L (ref 39–117)
BUN: 12 mg/dL (ref 6–23)
CO2: 19 mEq/L (ref 19–32)
GFR calc Af Amer: >90 mL/min (ref >90)
GFR calc non Af Amer: 84 mL/min — ABNORMAL LOW (ref >90)
Glucose, Bld: 77 mg/dL (ref 70–99)
Potassium: 3.8 mEq/L (ref 3.5–5.1)
Total Protein: 7.6 g/dL (ref 6.0–8.3)

## 2011-12-28 LAB — URINALYSIS, ROUTINE W REFLEX MICROSCOPIC
Ketones, ur: >80 mg/dL — AB
Nitrite: NEGATIVE
Protein, ur: 100 mg/dL — AB
Specific Gravity, Urine: 1.033 — ABNORMAL HIGH (ref 1.005–1.030)
Urobilinogen, UA: 1 mg/dL (ref 0.0–1.0)

## 2011-12-28 LAB — LIPASE, BLOOD: Lipase: 92 U/L — ABNORMAL HIGH (ref 11–59)

## 2011-12-28 LAB — URINE MICROSCOPIC-ADD ON

## 2011-12-28 MED ORDER — ONDANSETRON HCL 4 MG/2ML IJ SOLN
4.0000 mg | Freq: Four times a day (QID) | INTRAMUSCULAR | Status: DC | PRN
  Administered 2011-12-29: 4 mg via INTRAVENOUS
  Filled 2011-12-28: qty 2

## 2011-12-28 MED ORDER — KCL IN DEXTROSE-NACL 20-5-0.45 MEQ/L-%-% IV SOLN
INTRAVENOUS | Status: DC
  Administered 2011-12-29 – 2011-12-30 (×6): via INTRAVENOUS
  Filled 2011-12-28 (×10): qty 1000

## 2011-12-28 MED ORDER — MORPHINE SULFATE 2 MG/ML IJ SOLN: 1.0000 mg | INTRAMUSCULAR | Status: DC | PRN

## 2011-12-28 MED ORDER — ONDANSETRON HCL 4 MG/2ML IJ SOLN
4.0000 mg | Freq: Once | INTRAMUSCULAR | Status: AC
  Administered 2011-12-28: 4 mg via INTRAVENOUS
  Filled 2011-12-28: qty 2

## 2011-12-28 MED ORDER — ENOXAPARIN SODIUM 40 MG/0.4ML ~~LOC~~ SOLN
40.0000 mg | SUBCUTANEOUS | Status: DC
  Administered 2011-12-29 – 2011-12-30 (×2): 40 mg via SUBCUTANEOUS
  Filled 2011-12-28 (×4): qty 0.4

## 2011-12-28 MED ORDER — PANTOPRAZOLE SODIUM 40 MG IV SOLR
40.0000 mg | Freq: Every day | INTRAVENOUS | Status: DC
  Administered 2011-12-29 – 2011-12-30 (×2): 40 mg via INTRAVENOUS
  Filled 2011-12-28 (×3): qty 40

## 2011-12-28 MED ORDER — SODIUM CHLORIDE 0.9 % IV BOLUS (SEPSIS)
1000.0000 mL | Freq: Once | INTRAVENOUS | Status: AC
  Administered 2011-12-28: 1000 mL via INTRAVENOUS

## 2011-12-28 NOTE — H&P (Signed)
Re: Suzanne Green  DOB: 1976-05-18  MRN: [id number]   ASSESSMENT AND PLAN:  1. Morbid obesity. Weight - 279, BMI - 47.2.    RYGB Dec 19, 2011.   2.  Abdominal pain, cramping.  Dark stools.  Normal Hgb, normal pulse, guaiac neg stools.  Question gastroenteritis.  Mildly elevated Lipase.  To admit, hydrate, repeat labs in AM and see how she does.  3. 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  Minimal adhesions at time of surgery. 4. Migraines.   To become new patient of [facility].   HISTORY OF PRESENT ILLNESS:  Suzanne Green is a 36 y.o. (DOB: [birth year]) white female whose primary care physician is Marylen Ponto, MD, MD and comes to the [facility] with abdominal pain and dark stools after a RYGB.   She had a RYGB on 12/19/2011 by Dr. Algis Downs. Tonimarie Gritz.  She did well till about Sunday, 12/24/2011, when developed some loose stools.  She developed some severe nausea and talked to our office on 5/20. She developed loose stools and spoke to Dr. Abbey Chatters on Tuesday, 5/21, and he advised taking some Kaopectate.   She denies any concurrent fevers, chills, or dyspnea. She talked to our office and This seemed to help with her stools.  But she has had continued abdominal pain (crampy) and comes today with dark stools.  I talked to her on the phone earlier and we agreed to meet in the [facility].  Past Medical History   Diagnosis  Date   .  Migraines      since 1998   .  Plantar fasciitis     Past Surgical History   Procedure  Date   .  Cesarean section  12/26/07, 10/31/04   .  Tubal ligation  12/2007   .  Abdominal cyst removed  04/2002   .  Breath tek h pylori  09/18/2011     Procedure: BREATH TEK H PYLORI; Surgeon: Kandis Cocking, MD; Location: [facility]; Service: General; Laterality: N/A;    Current Outpatient Prescriptions   Medication  Sig  Dispense  Refill   .  ibuprofen (ADVIL,MOTRIN) 600 MG tablet  Take 600 mg by mouth 3 (three) times daily as needed.       .  rizatriptan (MAXALT) 10 MG tablet  Take 10 mg by mouth as needed. May repeat in 2 hours if needed      No Known Allergies   REVIEW OF SYSTEMS:  Skin: No history of rash. No history of abnormal moles.  Infection: No history of hepatitis or HIV. No history of MRSA.  Neurologic: History of migraine headaches well controlled on her current meds. She is to see [facility]. She has never seen anyone there before and cannot remember the name of the doctor she is to see.  Cardiac: No history of hypertension. No history of heart disease. No history of prior cardiac catheterization. No history of seeing a cardiologist.  Pulmonary: Pneumothorax at age 52. Treated with needle. Gets bronchitis easily.  Endocrine: No diabetes. No thyroid disease.  Gastrointestinal: No history of stomach disease. No history of liver disease. No history of gall bladder disease. No history of pancreas disease. No history of colon disease.  Urologic: No history of kidney stones. No history of bladder infections.  Musculoskeletal: Has plantar fasciitis. Has torn 3 tendons in her right ankle. Sees a podiatrist.  Hematologic: No bleeding disorder. No history of anemia. Not anticoagulated.  Psycho-social: The patient  is oriented. The patient has no obvious psychologic or social impairment to understanding our conversation and plan.   SOCIAL and FAMILY HISTORY:  She works as a Engineer, civil (consulting) at [employer].  Her husband is also a Pharmacologist. I think he works at [employer].  She has three children: 17,6,3.   PHYSICAL EXAM:  BP 120/72  Pulse 88  Temp(Src) 97.6 F (36.4 C) (Oral)  Resp 18  SpO2 100%  LMP 12/27/2011  General: Obese WF who is alert.  She looks washed out.  HEENT: Normal. Pupils equal. Neck: Supple. No mass. No thyroid mass. Carotid pulse okay with no bruit.  Lymph Nodes: No supraclavicular or cervical nodes.  Lungs: Clear to auscultation and symmetric breath sounds.  Heart: RRR. She  is not tachycardic.  No murmur or rub.   Abdomen: Soft.. Normal bowel sounds.  She is tender toward her RLQ, but has no guarding or rebound.  All her incisions look okay and the pain is not near any incision. Rectal: Guaiac neg stool.  Her stool that I got on my finger is green in color Extremities: Good strength and ROM in upper and lower extremities.  Neurologic: Grossly intact to motor and sensory function.  Psychiatric: Has normal mood and affect. Behavior is normal.   DATA REVIEWED:  Creat. 0.88.  Lipase - 92.  WBC - 9,400.  Hgb - 12.9.  [name], MD, [facility]  [address]  [place], [place] [address]  Phone: [phone] FAX: [phone]

## 2011-12-28 NOTE — Discharge Summary (Signed)
NAMEANNALISIA, Suzanne Green NO.:  [id number]  MEDICAL RECORD NO.:  [id number]  LOCATION:  1534                         FACILITY:  [facility]  PHYSICIAN:  Sandria Bales. Ezzard Standing, M.D.  DATE OF BIRTH:  07/15/1976  DATE OF ADMISSION:  12/19/2011 DATE OF DISCHARGE:  12/21/2011                              DISCHARGE SUMMARY  DISCHARGE DIAGNOSES: 1. Morbid obesity.  Weight 279, body mass index of 47.2. 2. History of migraine headaches. 3. Prior abdominal surgery with minimal intra-abdominal adhesions.  OPERATIONS PERFORMED:  The patient underwent a laparoscopic Roux-en-Y gastric bypass with upper endoscopy and enterolysis on Dec 19, 2011.  HISTORY OF ILLNESS:  Ms. 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 Juleen China.  She has had troubles with weight much of her adult life.  She has been through our preoperative bariatric program and now comes to the hospital for attempted laparoscopic Roux-en-Y gastric bypass.  PAST MEDICAL HISTORY: 1. She has history of migraines and is being seen by [facility]     [facility]. 2. She had a prior abdominal surgery for a retroperitoneal tumor in     2003.  The amount of adhesions she has is unclear at this time. 3. She had a left ankle tendon injury in August 2012 for which she has     recovered. 4. History of plantar fasciitis.  HOSPITAL COURSE:  She was taken to the operating room on the day of admission where she underwent a laparoscopic Roux-en-Y gastric bypass. She had some adhesions that actually were less than I anticipated.  Postoperatively she did well.  On the 1st postoperative day, her hemoglobin was 10.4, her white blood count was 9100.  She had her swallow test which showed no evidence of leak.  She had Dopplers of her lower extremities which showed no evidence of DVT.  She was started on liquids.  By her 2nd postop day, she was tolerating the water well, she  was advanced to a protein drink which she tolerated, and she was ready for discharge.  Her husband was in the room.  I reviewed her discharge instructions with her.  DISCHARGE INSTRUCTIONS: 1. Staying on her protein drinks until advanced by the nutritionist. 2. She should not drive for 3 or 4 days.  No heavy lifting about a     week.  After that, her activities are unlimited. 3. We had written her to be out of work for about a month from the date of surgery. 4. She was given Roxicet elixir as a pain medicine, and she was to     return to see me in about 2 weeks.  She already had an appointment     set up at her nutritionist in 2 weeks.   Sandria Bales. Ezzard Standing, M.D., FACS   DHN/MEDQ  D:  12/26/2011  T:  12/28/2011  Job:  [id number]  cc:   Juleen China, M.D.  Vilinda Flake, Ph.D. Fax: [phone]

## 2011-12-28 NOTE — ED Provider Notes (Signed)
History     CSN: [id number]  Arrival date & time 12/28/11  1801   First MD Initiated Contact with Patient 12/28/11 1809      Chief Complaint  Patient presents with  . Melena  . Weakness    (Consider location/radiation/quality/duration/timing/severity/associated sxs/prior treatment) HPI The patient presents one week after abdominal laparoscopic gastric bypass with concerns of abdominal pain and black stool.  She notes that she initially was doing well following her procedure.  3 days ago she gradually developed abdominal pain.  Since onset the pain has migrated from her epigastrium to her periumbilical area.  The pain is sharp, crampy.  She notes concurrent nausea, with rectal bleeding throughout this time course.  Initially the bleeding was read, she now has black stool.  She denies any concurrent fevers, chills, dyspnea, syncope.  She has been lightheaded.  She took one dose of oral medication without any relief in her symptoms. Past Medical History  Diagnosis Date  . Migraines     since 1998  . Plantar fasciitis   . GERD (gastroesophageal reflux disease)     Past Surgical History  Procedure Date  . Cesarean section 12/26/07, 10/31/04  . Tubal ligation 12/2007  . Abdominal cyst removed 04/2002  . Breath tek h pylori 09/18/2011    Procedure: BREATH TEK H PYLORI;  Surgeon: Kandis Cocking, MD;  Location: [facility];  Service: General;  Laterality: N/A;  . Gastric roux-en-y 12/19/2011    Procedure: LAPAROSCOPIC ROUX-EN-Y GASTRIC;  Surgeon: Kandis Cocking, MD;  Location: [facility];  Service: General;  Laterality: N/A;    History reviewed. No pertinent family history.  History  Substance Use Topics  . Smoking status: Never Smoker   . Smokeless tobacco: Never Used  . Alcohol Use: No    OB History    Grav Para Term Preterm Abortions TAB SAB Ect Mult Living                  Review of Systems  Constitutional:       HPI  HENT:       HPI otherwise negative  Eyes: Negative.    Respiratory:       HPI, otherwise negative  Cardiovascular:       HPI, otherwise nmegative  Gastrointestinal: Negative for vomiting.  Genitourinary:       HPI, otherwise negative  Musculoskeletal:       HPI, otherwise negative  Skin: Negative.   Neurological: Negative for syncope.    Allergies  Review of patient's allergies indicates no known allergies.  Home Medications   Current Outpatient Rx  Name Route Sig Dispense Refill  . CETIRIZINE HCL 10 MG PO TABS Oral Take 10 mg by mouth daily as needed. For allergy    . EPINEPHRINE 0.3 MG/0.3ML IJ DEVI Intramuscular Inject 0.3 mg into the muscle as needed. For beestings    . OXYCODONE-ACETAMINOPHEN 5-325 MG/5ML PO SOLN Oral Take 5-10 mLs by mouth every 4 (four) hours as needed. 200 mL 0  . PROMETHAZINE HCL 12.5 MG PO TABS Oral Take 12.5 mg by mouth every 6 (six) hours as needed. For nausea.    Marland Kitchen RIZATRIPTAN BENZOATE 10 MG PO TABS Oral Take 10 mg by mouth as needed. For migraine. May repeat in 2 hours if needed      BP 120/72  Pulse 88  Temp(Src) 97.6 F (36.4 C) (Oral)  Resp 18  SpO2 100%  LMP 12/27/2011  Physical Exam  Nursing note and vitals reviewed.  Constitutional: She is oriented to person, place, and time. She appears well-developed and well-nourished. No distress.  HENT:  Head: Normocephalic and atraumatic.  Eyes: Conjunctivae and EOM are normal.  Cardiovascular: Normal rate and regular rhythm.   Pulmonary/Chest: Effort normal and breath sounds normal. No stridor. No respiratory distress.  Abdominal: Soft. She exhibits no distension. There is tenderness in the right lower quadrant. There is no rigidity, no rebound and no guarding.       Surgical scars all well healing and appearance, all less than 2 cm in diameter and.  No surrounding erythema or edema or drainage.  Musculoskeletal: She exhibits no edema.  Neurological: She is alert and oriented to person, place, and time. No cranial nerve deficit.  Skin: Skin is  warm and dry.  Psychiatric: She has a normal mood and affect.    ED Course  Procedures (including critical care time)   Labs Reviewed  CBC  DIFFERENTIAL  COMPREHENSIVE METABOLIC PANEL  LIPASE, BLOOD  URINALYSIS, ROUTINE W REFLEX MICROSCOPIC   No results found.   No diagnosis found.    MDM  This generally well-appearing female presents one week after gastric bypass surgery, now with persistent lower abdominal pain, concerns of black stool. On exam patient is in no distress with unremarkable vital signs.  The patient's labs are further reassurance of the absence of significant infection.  Soon after my evaluation, the patient's surgeon, Dr. Ezzard Standing arrived.  He will admit the patient for further e/m of her post-operative pain.  Gerhard Munch, MD 12/28/11 2025

## 2011-12-28 NOTE — ED Notes (Signed)
Pt with recent roux en y on 12-19-11, started having black tar stools today at 2pm; has severe abdominal cramping and complains of weakness. Spoke with MD Ezzard Standing who referred her to come directly to ED.

## 2011-12-29 ENCOUNTER — Inpatient Hospital Stay (HOSPITAL_COMMUNITY): Payer: 59

## 2011-12-29 LAB — COMPREHENSIVE METABOLIC PANEL
ALT: 7 U/L (ref 0–35)
AST: 8 U/L (ref 0–37)
Alkaline Phosphatase: 55 U/L (ref 39–117)
CO2: 20 mEq/L (ref 19–32)
Chloride: 104 mEq/L (ref 96–112)
GFR calc Af Amer: >90 mL/min (ref >90)
GFR calc non Af Amer: 90 mL/min — ABNORMAL LOW (ref >90)
Glucose, Bld: 88 mg/dL (ref 70–99)
Potassium: 3.3 mEq/L — ABNORMAL LOW (ref 3.5–5.1)
Sodium: 138 mEq/L (ref 135–145)

## 2011-12-29 LAB — DIFFERENTIAL
Basophils Absolute: 0.1 10*3/uL (ref 0.0–0.1)
Basophils Relative: 1 % (ref 0–1)
Eosinophils Absolute: 0.4 10*3/uL (ref 0.0–0.7)
Monocytes Absolute: 0.7 10*3/uL (ref 0.1–1.0)
Monocytes Relative: 10 % (ref 3–12)
Neutro Abs: 4.3 10*3/uL (ref 1.7–7.7)
Neutrophils Relative %: 56 % (ref 43–77)

## 2011-12-29 LAB — CBC
Hemoglobin: 11.1 g/dL — ABNORMAL LOW (ref 12.0–15.0)
MCH: 28 pg (ref 26.0–34.0)
MCHC: 32.6 g/dL (ref 30.0–36.0)
RDW: 13.7 % (ref 11.5–15.5)

## 2011-12-29 MED ORDER — POTASSIUM CHLORIDE 20 MEQ/15ML (10%) PO LIQD
20.0000 meq | Freq: Two times a day (BID) | ORAL | Status: DC
  Administered 2011-12-29 – 2011-12-30 (×4): 20 meq via ORAL
  Filled 2011-12-29 (×6): qty 15

## 2011-12-29 MED ORDER — METRONIDAZOLE 500 MG PO TABS
500.0000 mg | ORAL_TABLET | Freq: Three times a day (TID) | ORAL | Status: DC
  Administered 2011-12-29 – 2011-12-31 (×6): 500 mg via ORAL
  Filled 2011-12-29 (×9): qty 1

## 2011-12-29 MED ORDER — POTASSIUM CHLORIDE 20 MEQ PO PACK
20.0000 meq | PACK | Freq: Two times a day (BID) | ORAL | Status: DC
  Filled 2011-12-29 (×2): qty 1

## 2011-12-29 MED ORDER — IOHEXOL 300 MG/ML  SOLN
100.0000 mL | Freq: Once | INTRAMUSCULAR | Status: AC | PRN
  Administered 2011-12-29: 100 mL via INTRAVENOUS

## 2011-12-29 NOTE — Progress Notes (Signed)
Notified MD Ezzard Standing concerning patients stool was positive for C.diff Means, Myrtie Hawk RN 12-29-2011 12:25pm

## 2011-12-29 NOTE — Progress Notes (Signed)
12/29/2011  14:00  CT scan shows edematous colon wall c/w colitis. C.Diff. PCR is positive  Patient's symptoms and PE c/w C diff colitis. Discussed with patient.  Will start oral Flagyl. Length of hospitalization will depend on how she does.  [name], MD, [facility] Surgery Pager: [phone] Office phone:  [phone]

## 2011-12-29 NOTE — Progress Notes (Signed)
General Surgery Note  LOS: 1 day  POD# 10 Room - 1530  Assessment/Plan: 1.  Abdominal pain (lower abdomen) and diarrhea - questionable gastroenteritis  To obtain CT scan of abdomen/pelvis, though patient appears less tender today.  To check C diff.  Still with mildly elevated lipase.  Continue IVF, recheck labs in AM, and clear liquid diet.  2. S/P RYGB - 12/19/2011 - D. Josee Speece 3.  Hypokalemia - to give both oral and IV K+  Subjective:  Feels about the same as last night, may be a little better.  Had 4 loose (watery) stools last night/this AM.  Some nausea (in waves), but no vomiting. Objective:   Filed Vitals:   12/29/11 0618  BP: 107/72  Pulse: 71  Temp: 97.4 F (36.3 C)  Resp: 18     Intake/Output from previous day:  05/23 0701 - 05/24 0700 In: 1342.1 [P.O.:120; I.V.:1222.1] Out: 700 [Urine:300; Stool:400]  Intake/Output this shift:      Physical Exam:   General: WN WF who is alert and oriented.    HEENT: Normal. Pupils equal. .   Lungs: Clear   Abdomen: Soft except some tenderness in the right lower abdomen.  To me, less tender than last night.   Wound: Doing okay.   Neurologic:  Grossly intact to motor and sensory function.   Psychiatric: Has normal mood and affect. Behavior is normal   Lab Results:    Basename 12/29/11 0410 12/28/11 1820  WBC 7.6 9.4  HGB 11.1* 12.9  HCT 34.0* 38.9  PLT 286 343    BMET   Basename 12/29/11 0410 12/28/11 1820  NA 138 137  K 3.3* 3.8  CL 104 100  CO2 20 19  GLUCOSE 88 77  BUN 9 12  CREATININE 0.83 0.88  CALCIUM 8.4 9.3    PT/INR  No results found for this basename: LABPROT:2,INR:2 in the last 72 hours  ABG  No results found for this basename: PHART:2,PCO2:2,PO2:2,HCO3:2 in the last 72 hours   Studies/Results:  No results found.   Anti-infectives:   Anti-infectives    None      [name], MD, FACS Pager: [phone],   [facility] Surgery Office: [phone] 12/29/2011

## 2011-12-29 NOTE — Progress Notes (Signed)
UR complete 

## 2011-12-30 LAB — CBC
HCT: 33.9 % — ABNORMAL LOW (ref 36.0–46.0)
MCHC: 33 g/dL (ref 30.0–36.0)
MCV: 86 fL (ref 78.0–100.0)
RDW: 13.9 % (ref 11.5–15.5)
WBC: 6 10*3/uL (ref 4.0–10.5)

## 2011-12-30 LAB — DIFFERENTIAL
Basophils Absolute: 0.1 10*3/uL (ref 0.0–0.1)
Eosinophils Relative: 6 % — ABNORMAL HIGH (ref 0–5)
Lymphocytes Relative: 38 % (ref 12–46)
Monocytes Absolute: 0.7 10*3/uL (ref 0.1–1.0)

## 2011-12-30 LAB — LIPASE, BLOOD: Lipase: 132 U/L — ABNORMAL HIGH (ref 11–59)

## 2011-12-30 LAB — BASIC METABOLIC PANEL
CO2: 24 mEq/L (ref 19–32)
Calcium: 8.6 mg/dL (ref 8.4–10.5)
Creatinine, Ser: 0.9 mg/dL (ref 0.50–1.10)

## 2011-12-30 NOTE — Progress Notes (Signed)
  Subjective: Feels much better but diarrhea x4 last night  Objective: Vital signs in last 24 hours: Temp:  [97.6 F (36.4 C)-98.1 F (36.7 C)] 97.9 F (36.6 C) (05/25 0900) Pulse Rate:  [63-93] 93  (05/25 0900) Resp:  [16-18] 18  (05/25 0900) BP: (94-125)/(63-81) 94/63 mmHg (05/25 0900) SpO2:  [96 %-98 %] 98 % (05/25 0900) Last BM Date: 12/30/11  Intake/Output from previous day: 05/24 0701 - 05/25 0700 In: 2472.1 [P.O.:160; I.V.:2312.1] Out: 1700 [Urine:1700] Intake/Output this shift: Total I/O In: 1407.5 [P.O.:20; I.V.:1387.5] Out: -   General appearance: alert, cooperative and no distress GI: soft, no significant tenderness, nd, wounds okay  Lab Results:   [facility] 12/30/11 0434 12/29/11 0410  WBC 6.0 7.6  HGB 11.2* 11.1*  HCT 33.9* 34.0*  PLT 282 286   BMET  Basename 12/30/11 0434 12/29/11 0410  NA 140 138  K 3.9 3.3*  CL 109 104  CO2 24 20  GLUCOSE 106* 88  BUN <3* 9  CREATININE 0.90 0.83  CALCIUM 8.6 8.4   PT/INR No results found for this basename: LABPROT:2,INR:2 in the last 72 hours ABG No results found for this basename: PHART:2,PCO2:2,PO2:2,HCO3:2 in the last 72 hours  Studies/Results: Ct Abdomen Pelvis W Contrast  12/29/2011  *RADIOLOGY REPORT*  Clinical Data: Abdominal pain, nausea and diarrhea.  History of gastric bypass surgery.  CT ABDOMEN AND PELVIS WITH CONTRAST  Technique:  Multidetector CT imaging of the abdomen and pelvis was performed following the standard protocol during bolus administration of intravenous contrast.  Contrast: OMNIPAQUE IOHEXOL 300 MG/ML  SOLN  Comparison: None  Findings: The lung bases are clear except for minimal dependent atelectasis.  The liver, spleen, pancreas, adrenal glands and kidneys are unremarkable.  A simple appearing left renal cyst is noted.  There are surgical changes related to recent gastric bypass surgery.  No complicating features are demonstrated.  No leaking oral contrast or free air.  No  abscess is identified.  The small bowel is unremarkable.  No small bowel obstruction.  There is diffuse wall thickening and submucosal edema involving entire colon consistent with C difficile colitis.  The aorta is normal in caliber.  The major branch vessels are normal.  No mesenteric or retroperitoneal masses or adenopathy. The uterus and ovaries are normal.  The bladder is normal.  No pelvic mass, adenopathy or free pelvic fluid collections.  There is a small anterior abdominal wall hernia just below the umbilicus containing fat and a small amount of fluid.  No inguinal hernia.  IMPRESSION:  1.  Postoperative changes from recent gastric bypass surgery.  No complicating features such as leak, abscess or obstruction. 2.  Diffuse colonic inflammation most consistent with C difficile colitis.  Original Report Authenticated By: P. Loralie Champagne, M.D.    Anti-infectives: Anti-infectives     Start     Dose/Rate Route Frequency Ordered Stop   12/29/11 1500   metroNIDAZOLE (FLAGYL) tablet 500 mg        500 mg Oral 3 times per day 12/29/11 1410 01/12/12 1359          Assessment/Plan: s/p * No surgery found * she looks good and feels much better.  pain improved and diarrhea slowed but still present.  If she continues to improve, she should be okay for discharge tomorrow.  LOS: 2 days    Suzanne Green DAVID 12/30/2011

## 2011-12-31 MED ORDER — METRONIDAZOLE 500 MG PO TABS: 500.0000 mg | ORAL_TABLET | Freq: Three times a day (TID) | ORAL | Status: AC

## 2011-12-31 MED ORDER — METRONIDAZOLE 500 MG PO TABS: 500.0000 mg | ORAL_TABLET | Freq: Three times a day (TID) | ORAL | Status: DC

## 2011-12-31 NOTE — Progress Notes (Signed)
  Subjective: No pain.  3BM yesterday.  She says that stools are starting to firm up.  Objective: Vital signs in last 24 hours: Temp:  [97.8 F (36.6 C)-97.9 F (36.6 C)] 97.8 F (36.6 C) (05/26 0635) Pulse Rate:  [62-93] 62  (05/26 0635) Resp:  [16-18] 16  (05/26 0635) BP: (94-124)/(63-81) 110/73 mmHg (05/26 0635) SpO2:  [97 %-99 %] 97 % (05/26 0635) Last BM Date: 12/30/11  Intake/Output from previous day: 05/25 0701 - 05/26 0700 In: 5417.5 [P.O.:500; I.V.:4917.5] Out: 2600 [Urine:2600] Intake/Output this shift:    General appearance: alert, cooperative and no distress Resp: clear to auscultation bilaterally Cardio: regular rate and rhythm, S1, S2 normal, no murmur, click, rub or gallop GI: soft, non-tender; bowel sounds normal; no masses,  no organomegaly  Lab Results:   Basename 12/30/11 0434 12/29/11 0410  WBC 6.0 7.6  HGB 11.2* 11.1*  HCT 33.9* 34.0*  PLT 282 286   BMET  Basename 12/30/11 0434 12/29/11 0410  NA 140 138  K 3.9 3.3*  CL 109 104  CO2 24 20  GLUCOSE 106* 88  BUN <3* 9  CREATININE 0.90 0.83  CALCIUM 8.6 8.4   PT/INR No results found for this basename: LABPROT:2,INR:2 in the last 72 hours ABG No results found for this basename: PHART:2,PCO2:2,PO2:2,HCO3:2 in the last 72 hours  Studies/Results: Ct Abdomen Pelvis W Contrast  12/29/2011  *RADIOLOGY REPORT*  Clinical Data: Abdominal pain, nausea and diarrhea.  History of gastric bypass surgery.  CT ABDOMEN AND PELVIS WITH CONTRAST  Technique:  Multidetector CT imaging of the abdomen and pelvis was performed following the standard protocol during bolus administration of intravenous contrast.  Contrast: OMNIPAQUE IOHEXOL 300 MG/ML  SOLN  Comparison: None  Findings: The lung bases are clear except for minimal dependent atelectasis.  The liver, spleen, pancreas, adrenal glands and kidneys are unremarkable.  A simple appearing left renal cyst is noted.  There are surgical changes related to recent  gastric bypass surgery.  No complicating features are demonstrated.  No leaking oral contrast or free air.  No abscess is identified.  The small bowel is unremarkable.  No small bowel obstruction.  There is diffuse wall thickening and submucosal edema involving entire colon consistent with C difficile colitis.  The aorta is normal in caliber.  The major branch vessels are normal.  No mesenteric or retroperitoneal masses or adenopathy. The uterus and ovaries are normal.  The bladder is normal.  No pelvic mass, adenopathy or free pelvic fluid collections.  There is a small anterior abdominal wall hernia just below the umbilicus containing fat and a small amount of fluid.  No inguinal hernia.  IMPRESSION:  1.  Postoperative changes from recent gastric bypass surgery.  No complicating features such as leak, abscess or obstruction. 2.  Diffuse colonic inflammation most consistent with C difficile colitis.  Original Report Authenticated By: P. Loralie Champagne, M.D.    Anti-infectives: Anti-infectives     Start     Dose/Rate Route Frequency Ordered Stop   12/29/11 1500   metroNIDAZOLE (FLAGYL) tablet 500 mg        500 mg Oral 3 times per day 12/29/11 1410 01/12/12 1359          Assessment/Plan: s/p * No surgery found * she seems to be doing well.  plan for discharge today with oral flagyl and f.u next week with Dr. Ezzard Standing  LOS: 3 days    Suzanne Green DAVID 12/31/2011

## 2011-12-31 NOTE — Progress Notes (Signed)
Discharged ambulatory. Prescription given for flagyl, states understanding of discharge instructions, patient and husband Charity fundraiser.

## 2012-01-01 NOTE — Progress Notes (Signed)
Discharge summary sent to payer through [facility]  

## 2012-01-02 ENCOUNTER — Encounter: Payer: 59 | Admitting: *Deleted

## 2012-01-02 NOTE — Patient Instructions (Signed)
Patient to follow Phase 3A-Soft, High Protein Diet and follow-up at [facility] in 6 weeks for 2 months post-op nutrition visit for diet advancement. 

## 2012-01-02 NOTE — Progress Notes (Signed)
  Bariatric Class:  Appt start time: 1600   End time:  1700.  2 Week Post-Operative Nutrition Class  Patient was seen on 01/02/2012 for Post-[facility]ative [facility].   Surgery date: 12/19/11  Surgery type: RYGB  Start weight at [facility]: 277.0 lbs  Weight today: 253.5 lbs Weight change: 23.5 lbs Total weight lost: 23.5 lbs BMI: 42.2 kg/m^2  The following the learning objective met the patient during this course:   Identifies Phase 3A (Soft, High Proteins) Dietary Goals and will begin from 2 weeks post-operatively to 2 months post-operatively   Identifies appropriate sources of fluids and proteins   States protein recommendations and appropriate sources post-operatively  Identifies the need for appropriate texture modifications, mastication, and bite sizes when consuming solids  Identifies appropriate multivitamin and calcium sources post-operatively  Describes the need for physical activity post-operatively and will follow MD recommendations  States when to call healthcare provider regarding medication questions or post-operative complications  Handouts given during class include:  Phase 3A: Soft, High Protein Diet Handout  Follow-Up Plan: Patient will follow-up at [facility] in 6 weeks for 2 months post-op nutrition visit for diet advancement per MD.

## 2012-01-12 ENCOUNTER — Ambulatory Visit (INDEPENDENT_AMBULATORY_CARE_PROVIDER_SITE_OTHER): Payer: Commercial Managed Care - PPO | Admitting: Surgery

## 2012-01-12 ENCOUNTER — Encounter (INDEPENDENT_AMBULATORY_CARE_PROVIDER_SITE_OTHER): Payer: Self-pay | Admitting: Surgery

## 2012-01-12 DIAGNOSIS — Z9884 Bariatric surgery status: Secondary | ICD-10-CM | POA: Insufficient documentation

## 2012-01-12 DIAGNOSIS — K912 Postsurgical malabsorption, not elsewhere classified: Secondary | ICD-10-CM

## 2012-01-12 NOTE — Progress Notes (Signed)
Re:   Suzanne Green DOB:   November 08, 1975 MRN:   [id number]  ASSESSMENT AND PLAN: 1.  Morbid obesity.  Weight - 279, BMI - 47.2.  For RYGB - Dec 19, 2011.  She is down over 30 pounds.  She still feels weak, but is getting better.  I wrote her to be out of work until 01/30/2012.  She'll see me back in 10 weeks.  To check labs.   2.  Migraines.  To become new patient of [facility]. 3.  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  Left ankle tendon injury - August 2012 5.  Plantar fasciitis. 6.  Post op acquired C. Diff requiring hospitalization from 12/19/2011 - 12/21/2011.  She finished the Flagyl just a couple of days ago.  REFERRING PHYSICIAN: Marylen Ponto, MD, MD  HISTORY OF PRESENT ILLNESS: Suzanne Green is a 36 y.o. (DOB: 1975-09-19)  white female whose primary care physician is Marylen Ponto, MD, MD and comes to me today for post op visit for bariatric surgery.    She developed C Diff during her post op period which required re-hospitalization.  She has had some nausea, but her bowels are better.  She has lost 30+ pounds.  She has her son with her.   Past Medical History  Diagnosis Date  . Migraines     since 1998  . Plantar fasciitis   . GERD (gastroesophageal reflux disease)       Current Outpatient Prescriptions  Medication Sig Dispense Refill  . cetirizine (ZYRTEC) 10 MG tablet Take 10 mg by mouth daily as needed. For allergy      . EPINEPHrine (EPI-PEN) 0.3 mg/0.3 mL DEVI Inject 0.3 mg into the muscle as needed. For beestings      . oxyCODONE-acetaminophen (ROXICET) 5-325 MG/5ML solution Take 5-10 mLs by mouth every 4 (four) hours as needed.  200 mL  0  . promethazine (PHENERGAN) 12.5 MG tablet Take 12.5 mg by mouth every 6 (six) hours as needed. For nausea.      . rizatriptan (MAXALT) 10 MG tablet Take 10 mg by mouth as needed. For migraine. May repeat in 2 hours if needed        No Known Allergies  REVIEW OF  SYSTEMS:  Infection:  C. Diff post op requiring hospitalization - May 2013. Neurologic:  History of migraine headaches well controlled on her current meds.  She is to see [facility].  She has never seen anyone there before and cannot remember the name of the doctor she is to see.  Pulmonary:  Pneumothorax at age 40. Treated with needle. Gets bronchitis easily. Musculoskeletal:  Has plantar fasciitis.  Has torn 3 tendons in her right ankle.  Sees a podiatrist.  SOCIAL and FAMILY HISTORY: She works as a Engineer, civil (consulting) at [employer].   Her husband is also a Pharmacologist.  I think he works at [employer]. She has three children: 17,6,3.  PHYSICAL EXAM: BP 124/74  Pulse 82  Temp(Src) 98.1 F (36.7 C) (Temporal)  Resp 16  Ht 5\' 5"  (1.651 m)  Wt 244 lb 9.6 oz (110.95 kg)  BMI 40.70 kg/m2  LMP 12/27/2011  General: Obese WF who is alert and generally healthy appearing.  HEENT: Normal. Pupils equal.  Neck: Supple. No mass.  No thyroid mass.  Carotid pulse okay with no bruit. Lungs: Clear to auscultation and symmetric breath sounds. Heart:  RRR. No murmur or rub.  Abdomen:  Soft. No mass. No tenderness. No hernia. Normal bowel sounds.  Infraumbilical mid line abdominal scar. Extremities:  Good strength and ROM  in upper and lower extremities. Neurologic:  Grossly intact to motor and sensory function. Psychiatric: Has normal mood and affect.  Marland Kitchen  DATA REVIEWED: None new.  [name], MD,  [facility] [address] [place]  [address]   [place], [place]    [address] Phone:  [phone] FAX:  [phone]

## 2012-01-15 NOTE — Discharge Summary (Signed)
[name], [name]             ACCOUNT NO.:  [id number]  MEDICAL RECORD NO.:  [id number]  LOCATION:                               FACILITY:  [facility]  PHYSICIAN:  Sandria Bales. Ezzard Standing, M.D.  DATE OF BIRTH:  January 24, 1976  DATE OF ADMISSION: 28 Dec 2011 DATE OF DISCHARGE:  12/31/2011                              DISCHARGE SUMMARY   DISCHARGE DIAGNOSES: 1. Clostridium difficile colitis. 2. Status post Roux-en-Y gastric bypass on Dec 19, 2011 by Dr. [name]. 3. Hypokalemia, corrected. 4. History of migraine headaches. 5. Prior abdominal surgery for retroperitoneal tumor in 2003.  OPERATION PERFORMED:  None.  HISTORY OF ILLNESS:  Ms. Suzanne Green is a 36 year old white female.  Her primary care doctor is Dr. Juleen China.  She had gone through our preoperative bariatric program and underwent a successful laparoscopic Roux-en-Y gastric bypass on Dec 19, 2011 by Dr. [name].  She was doing well until about Dec 24, 2011 when she developed some loose stools, had increasingly severe nausea.  She denied any concurrent fevers, chills, or dyspnea.  She tried taking some Kaopectate at home.  This would not seem to help or its help may be a little bit.  Because of continued abdominal cramping and loose stools, admitted to [facility] on Dec 28, 2011.  PAST MEDICAL HISTORY:  Significant for she had a history of migraine headaches.  On admission, she was placed on IV fluids.  Her potassium was noted to be slightly low at 3.3, which was corrected and stools were obtained for C. diff.  On Dec 29, 2011, her stool PCR came back positive for C. Diff.  She was started on oral Flagyl.  She was continued in the hospital for 2 more days until the 26th.  She felt that her stools were getting better, she was better hydrated, and doing well with liquids, and then discharged home.  DISCHARGE INSTRUCTIONS:  Included; 1. Continuing her postop bariatric diet. 2. She had really no limits for lifting or  activities. 3. She was given Flagyl to be kept on for a total of 2 weeks.  She     will see me back in the office for followup.  I think she has an     appointment on June 7, 201.  She knows to call for any further problems.   Sandria Bales. Ezzard Standing, M.D., FACS   DHN/MEDQ  D:  01/14/2012  T:  01/15/2012  Job:  [id number]  cc:   Juleen China, MD

## 2012-02-14 ENCOUNTER — Encounter: Payer: 59 | Attending: Surgery | Admitting: *Deleted

## 2012-02-14 DIAGNOSIS — Z713 Dietary counseling and surveillance: Secondary | ICD-10-CM | POA: Insufficient documentation

## 2012-02-14 DIAGNOSIS — Z01818 Encounter for other preprocedural examination: Secondary | ICD-10-CM | POA: Insufficient documentation

## 2012-02-14 NOTE — Patient Instructions (Addendum)
Goals:  Follow Phase 3B: High Protein + Non-Starchy Vegetables once nausea/vomiting subsides and you are tolerating protein shakes.  Eat 3-6 small meals/snacks, every 3-5 hrs as tolerated  Increase lean protein foods to meet 60-80g goal.  Increase fluid intake to 64+ oz  Avoid drinking 15 minutes before, during and 30 minutes after eating  Aim for >30 min of physical activity daily  Contact MD or myself if unable to tolerate food/shakes within the next week

## 2012-02-14 NOTE — Progress Notes (Addendum)
Follow-up visit:  8 Weeks Post-Operative RYGB Surgery  Medical Nutrition Therapy:  Appt start time: 1230 end time:  1300.  Primary concerns today: Post-operative Bariatric Surgery Nutrition Management. Reports extreme N/V related to c.diff infection post-op. Concerned about not getting all her supplements and protein in. Fish and scallops are going down the best. Is taking Zofran with some relief. Discussed sipping on higher protein shakes.    Surgery date: 12/19/11  Surgery type: RYGB  Start weight at [facility]: 277.0 lbs  Weight today: 231.0 lbs Weight change: 22.5 lbs Total weight lost: 46.0 lbs BMI: 38.4 kg/m^2  Weight goal: 155 lbs % Weight goal met: 38%  Fluid intake: 0-20 oz Estimated total protein intake: 0-20 g  Medications: Not taking Roxicet, Maxalt, or Zyrtec; Now taking Zofran for nausea Supplementation: Not taking d/t nausea/vomiting  Using straws: No Drinking while eating: No Hair loss: No Carbonated beverages: No N/V/D/C: N/V almost daily since surgery d/t bout with c. diff Dumping syndrome: None  Recent physical activity:  None d/t nausea  Progress Towards Goal(s):  In progress.  Handouts given during visit include:  Phase 3B: High Protein + Non-Starchy Vegetables   Nutritional Diagnosis:  NI-5.3 Inadequate protein-energy intake related to extreme nausea and vomiting as evidenced by patient intake of <30% of post-op protein and energy needs.  Intervention:  Nutrition education.  Monitoring/Evaluation:  Dietary intake, exercise, and body weight. Follow up in 1 months for 3 month post-op visit or earlier if needed.

## 2012-02-15 ENCOUNTER — Encounter: Payer: Self-pay | Admitting: *Deleted

## 2012-03-13 ENCOUNTER — Encounter: Payer: Self-pay | Admitting: *Deleted

## 2012-03-13 ENCOUNTER — Encounter: Payer: 59 | Attending: Surgery | Admitting: *Deleted

## 2012-03-13 DIAGNOSIS — Z01818 Encounter for other preprocedural examination: Secondary | ICD-10-CM | POA: Insufficient documentation

## 2012-03-13 DIAGNOSIS — Z713 Dietary counseling and surveillance: Secondary | ICD-10-CM | POA: Insufficient documentation

## 2012-03-13 NOTE — Progress Notes (Addendum)
Follow-up visit:  10 Weeks Post-Operative RYGB Surgery  Medical Nutrition Therapy:  Appt start time: 1230 end time:  1300.  Primary concerns today: Post-operative Bariatric Surgery Nutrition Management. Suzanne Green returns today and reports she is much better.  No problems reported at this time. Fluid and protein increasing and has started walking 2 days/week. Plans to increase as well. Non-compliance with calcium reported because she "cannot remember to take them". Discussed ways to remind herself.  Surgery date: 12/19/11  Surgery type: RYGB  Start weight at [facility]: 277.0 lbs  Weight today: 219.5 lbs Weight change: 11.5 lbs Total weight lost: 57.5 lbs BMI: 36.5 kg/m^2  TANITA  BODY COMP RESULTS  02/14/12 03/13/12   %Fat 48.0% 46.4%   Fat Mass (lbs) 111.0 102.0   Fat Free Mass (lbs) 120.0 117.5   Total Body Water (lbs) 88.0 86.0   Weight goal: 155 lbs % Weight goal met: 47%  Medications: Not taking any medications Supplementation: Taking 4-5 days/week "when I can remember"  24 Hour Recall:  B: Yogurt S: Protein shake (17g) L: 2-3 oz lean protein S: None D: Salad w/ grilled chicken (2-3 oz), vinaigrette dressing S: Not usually  Fluids: 32-40 oz Protein: 60-65 g  Using straws: No Drinking while eating: No Hair loss: No Carbonated beverages: No N/V/D/C: N/V from C. diff now resolved.  Dumping syndrome: None  Recent physical activity:  Starting to increase walking (~2 days/wk)  Progress Towards Goal(s):  In progress.   Nutritional Diagnosis:  NI-5.3 Inadequate protein-energy intake related to extreme nausea and vomiting as evidenced by patient intake of <30% of post-op protein and energy needs.  Intervention:  Nutrition education.  Monitoring/Evaluation:  Dietary intake, exercise, and body weight. Follow up in 3 months for 6 month post-op visit or earlier if needed.

## 2012-03-13 NOTE — Patient Instructions (Addendum)
Goals:  Continue to follow Phase 3B: High Protein + Non-Starchy Vegetables once nausea/vomiting subsides and you are tolerating protein shakes.  Eat 3-6 small meals/snacks, every 3-5 hrs as tolerated  Increase lean protein foods to meet 60-80g goal.  Increase fluid intake to 64+ oz  Avoid drinking 15 minutes before, during and 30 minutes after eating  Aim for >30 min of physical activity daily

## 2012-03-25 ENCOUNTER — Other Ambulatory Visit (INDEPENDENT_AMBULATORY_CARE_PROVIDER_SITE_OTHER): Payer: Self-pay | Admitting: Surgery

## 2012-03-25 LAB — FOLATE: Folate: 12.7 ng/mL

## 2012-03-25 LAB — CBC WITH DIFFERENTIAL/PLATELET
Basophils Absolute: 0.1 10*3/uL (ref 0.0–0.1)
Basophils Relative: 1 % (ref 0–1)
Eosinophils Absolute: 0.1 10*3/uL (ref 0.0–0.7)
Eosinophils Relative: 2 % (ref 0–5)
HCT: 35 % — ABNORMAL LOW (ref 36.0–46.0)
MCHC: 33.4 g/dL (ref 30.0–36.0)
MCV: 87.3 fL (ref 78.0–100.0)
Monocytes Absolute: 0.8 10*3/uL (ref 0.1–1.0)
RDW: 16 % — ABNORMAL HIGH (ref 11.5–15.5)

## 2012-03-25 LAB — COMPREHENSIVE METABOLIC PANEL
AST: 10 U/L (ref 0–37)
Alkaline Phosphatase: 70 U/L (ref 39–117)
BUN: 17 mg/dL (ref 6–23)
Creat: 0.8 mg/dL (ref 0.50–1.10)

## 2012-03-25 LAB — VITAMIN B12: Vitamin B-12: 939 pg/mL — ABNORMAL HIGH (ref 211–911)

## 2012-03-29 ENCOUNTER — Ambulatory Visit (INDEPENDENT_AMBULATORY_CARE_PROVIDER_SITE_OTHER): Payer: Commercial Managed Care - PPO | Admitting: Surgery

## 2012-03-29 ENCOUNTER — Encounter (INDEPENDENT_AMBULATORY_CARE_PROVIDER_SITE_OTHER): Payer: Self-pay | Admitting: Surgery

## 2012-03-29 VITALS — BP 110/70 | HR 60 | Temp 97.3°F | Resp 12 | Ht 64.5 in | Wt 215.5 lb

## 2012-03-29 DIAGNOSIS — Z9884 Bariatric surgery status: Secondary | ICD-10-CM

## 2012-03-29 NOTE — Progress Notes (Signed)
Re:   Suzanne Green DOB:   [birth year] MRN:   [id number]  ASSESSMENT AND PLAN: 1.  Morbid obesity.  Weight - 279, BMI - 47.2.  For RYGB - Dec 19, 2011.  She is down over 60 pounds.  She is doing very well.  Her labs look good and I gave her a copy.  She is to return in 3 months.  Will check labs again.   2.  Migraines.  Sees  of [facility].  Stable. 3.  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  Left ankle tendon injury - August 2012 5.  Plantar fasciitis. 6.  Post op acquired C. Diff requiring hospitalization from 12/19/2011 - 12/21/2011.  No further problem.    REFERRING PHYSICIAN: Marylen Ponto, MD  HISTORY OF PRESENT ILLNESS: Suzanne Green is a 37 y.o. (DOB: Jun 15, 1976)  white female whose primary care physician is HOLT,LYNLEY S, MD and comes to me today for follow upt for bariatric surgery.    She is doing much better.  Her nausea has resolved.  She said that her husband does not recognize her from behind.  She's walking around 30 minutes per day.  But I am not sure that she is exercising enough.  I went over with her my expectation of 3 miles/day - 5 days per week.  Otherwise she is doing very well.   Past Medical History  Diagnosis Date  . Migraines     since 1998  . Plantar fasciitis   . GERD (gastroesophageal reflux disease)       Current Outpatient Prescriptions  Medication Sig Dispense Refill  . cetirizine (ZYRTEC) 10 MG tablet Take 10 mg by mouth daily as needed. For allergy      . EPINEPHrine (EPI-PEN) 0.3 mg/0.3 mL DEVI Inject 0.3 mg into the muscle as needed. For beestings      . ondansetron (ZOFRAN) 4 MG tablet Take 4 mg by mouth every 8 (eight) hours as needed.      . promethazine (PHENERGAN) 12.5 MG tablet Take 12.5 mg by mouth every 6 (six) hours as needed. For nausea.      . rizatriptan (MAXALT) 10 MG tablet Take 10 mg by mouth as needed. For migraine. May repeat in 2 hours if needed        No Known  Allergies  REVIEW OF SYSTEMS:  Infection:  C. Diff post op requiring hospitalization - May 2013. Neurologic:  History of migraine headaches well controlled on her current meds.  She is to see [facility].  She has never seen anyone there before and cannot remember the name of the doctor she is to see.  Pulmonary:  Pneumothorax at age 60. Treated with needle. Gets bronchitis easily. Musculoskeletal:  Has plantar fasciitis.  Has torn 3 tendons in her right ankle.  Sees a podiatrist.  SOCIAL and FAMILY HISTORY: She works as a Engineer, civil (consulting) at [employer].   Her husband is also a Pharmacologist.  I think he works at [employer]. She has three children: 17,6,3.  PHYSICAL EXAM: BP 110/70  Pulse 60  Temp 97.3 F (36.3 C) (Oral)  Resp 12  Ht 5' 4.5" (1.638 m)  Wt 215 lb 8 oz (97.75 kg)  BMI 36.42 kg/m2  General: Obese WF who is alert and generally healthy appearing.  HEENT: Normal. Pupils equal.  Lungs: Clear to auscultation and symmetric breath sounds. Heart:  RRR. No murmur or rub.  Abdomen: Soft.  No mass. No tenderness. No hernia. Normal bowel sounds.  Infraumbilical mid line abdominal scar. Extremities:  Good strength and ROM  in upper and lower extremities. Neurologic:  Grossly intact to motor and sensory function. Psychiatric: Has normal mood and affect.  Marland Kitchen  DATA REVIEWED: Labs - copy to patient.  She can back off her B12.  [name], MD,  [facility] [address]  [address]   [place], [place]    [address] Phone:  [phone] FAX:  [phone]

## 2012-06-13 ENCOUNTER — Encounter: Payer: 59 | Attending: Surgery | Admitting: *Deleted

## 2012-06-13 ENCOUNTER — Encounter: Payer: Self-pay | Admitting: *Deleted

## 2012-06-13 DIAGNOSIS — Z713 Dietary counseling and surveillance: Secondary | ICD-10-CM | POA: Insufficient documentation

## 2012-06-13 DIAGNOSIS — Z01818 Encounter for other preprocedural examination: Secondary | ICD-10-CM | POA: Insufficient documentation

## 2012-06-13 NOTE — Progress Notes (Signed)
Follow-up visit:  6 Months Post-Operative RYGB Surgery  Medical Nutrition Therapy:  Appt start time: 1000  End time:  1030.  Primary concerns today: Post-operative Bariatric Surgery Nutrition Management. Suzanne Green returns today for 6 mo f/u with additional wt loss of 29 lbs. Doing well with no problems reported.   Surgery date: 12/19/11  Surgery type: RYGB  Start weight at [facility]: 277.0 lbs  Weight today: 190.5 lbs Weight change: 29.0 lbs Total weight lost: 86.5 lbs BMI: 31.7 kg/m^2  TANITA  BODY COMP RESULTS  02/14/12 03/13/12 06/13/12   %Fat 48.0% 46.4% 38.6%   Fat Mass (lbs) 111.0 102.0 73.5   Fat Free Mass (lbs) 120.0 117.5 117.0   Total Body Water (lbs) 88.0 86.0 85.5   Weight goal: 155 lbs % Weight goal met: 71%  Medications: EPI pen and maxalt (prn) Supplementation: Taking regularly; MD decreased B12 to every 2-3 days d/t high levels on labs  24 Hour Recall:  B: Yogurt S: Cheese (1 oz) L: 2-3 oz lean protein on salad S: Protein bar (Atkins) D: Salad w/ grilled chicken (2-3 oz), vinaigrette dressing S: Not usually  Fluids: 55-60 oz Protein: 60-65 g  Using straws: No Drinking while eating: No Hair loss: Yes; Discussed adding biotin Carbonated beverages: No N/V/D/C:  Diarrhea a few time; not problematic.  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   Nutritional Diagnosis:  NI-5.3 Inadequate protein-energy intake related to extreme nausea and vomiting as evidenced by patient intake of <30% of post-op protein and energy needs.  Intervention:  Nutrition education.  Monitoring/Evaluation:  Dietary intake, exercise, and body weight. Follow up in 6 months for 12 month post-op visit or earlier if needed.

## 2012-06-13 NOTE — Patient Instructions (Signed)
Goals:  Advance to Phase IV: Low-fat, Low-Carb Solid Food.  Eat 3-6 small meals/snacks, every 3-5 hrs as tolerated  Increase lean protein foods to meet 60-80g goal.  Increase fluid intake to 64+ oz  Avoid drinking 15 minutes before, during and 30 minutes after eating  Aim for >30 min of physical activity daily

## 2012-07-03 ENCOUNTER — Encounter (INDEPENDENT_AMBULATORY_CARE_PROVIDER_SITE_OTHER): Payer: Self-pay | Admitting: Surgery

## 2012-09-12 ENCOUNTER — Other Ambulatory Visit (HOSPITAL_COMMUNITY): Payer: Self-pay | Admitting: Family Medicine

## 2012-09-12 DIAGNOSIS — G43511 Persistent migraine aura without cerebral infarction, intractable, with status migrainosus: Secondary | ICD-10-CM

## 2012-09-13 ENCOUNTER — Ambulatory Visit (HOSPITAL_COMMUNITY)
Admission: RE | Admit: 2012-09-13 | Discharge: 2012-09-13 | Disposition: A | Discharge status: Home / Self Care | Payer: 59 | Source: Ambulatory Visit | Attending: Family Medicine | Admitting: Family Medicine

## 2012-09-13 DIAGNOSIS — G43511 Persistent migraine aura without cerebral infarction, intractable, with status migrainosus: Secondary | ICD-10-CM

## 2012-09-13 DIAGNOSIS — G930 Cerebral cysts: Secondary | ICD-10-CM | POA: Insufficient documentation

## 2012-09-13 DIAGNOSIS — G43909 Migraine, unspecified, not intractable, without status migrainosus: Secondary | ICD-10-CM | POA: Insufficient documentation

## 2012-09-13 MED ORDER — GADOBENATE DIMEGLUMINE 529 MG/ML IV SOLN
20.0000 mL | Freq: Once | INTRAVENOUS | Status: AC
  Administered 2012-09-13: 17 mL via INTRAVENOUS

## 2014-08-07 HISTORY — PX: BREAST IMPLANT EXCHANGE: SHX6296

## 2014-08-07 HISTORY — PX: AUGMENTATION MAMMAPLASTY: SUR837

## 2015-03-13 ENCOUNTER — Encounter (HOSPITAL_COMMUNITY): Payer: Self-pay | Admitting: Emergency Medicine

## 2015-03-13 ENCOUNTER — Emergency Department (HOSPITAL_COMMUNITY)
Admission: EM | Admit: 2015-03-13 | Discharge: 2015-03-13 | Disposition: A | Discharge status: Home / Self Care | Payer: PRIVATE HEALTH INSURANCE | Attending: Emergency Medicine | Admitting: Emergency Medicine

## 2015-03-13 ENCOUNTER — Emergency Department (HOSPITAL_COMMUNITY): Payer: PRIVATE HEALTH INSURANCE

## 2015-03-13 DIAGNOSIS — Y9289 Other specified places as the place of occurrence of the external cause: Secondary | ICD-10-CM | POA: Insufficient documentation

## 2015-03-13 DIAGNOSIS — Z79899 Other long term (current) drug therapy: Secondary | ICD-10-CM | POA: Diagnosis not present

## 2015-03-13 DIAGNOSIS — Y998 Other external cause status: Secondary | ICD-10-CM | POA: Diagnosis not present

## 2015-03-13 DIAGNOSIS — G43909 Migraine, unspecified, not intractable, without status migrainosus: Secondary | ICD-10-CM | POA: Insufficient documentation

## 2015-03-13 DIAGNOSIS — S60221A Contusion of right hand, initial encounter: Secondary | ICD-10-CM | POA: Insufficient documentation

## 2015-03-13 DIAGNOSIS — Z8719 Personal history of other diseases of the digestive system: Secondary | ICD-10-CM | POA: Diagnosis not present

## 2015-03-13 DIAGNOSIS — X58XXXA Exposure to other specified factors, initial encounter: Secondary | ICD-10-CM | POA: Insufficient documentation

## 2015-03-13 DIAGNOSIS — S6991XA Unspecified injury of right wrist, hand and finger(s), initial encounter: Secondary | ICD-10-CM | POA: Diagnosis present

## 2015-03-13 DIAGNOSIS — Y9389 Activity, other specified: Secondary | ICD-10-CM | POA: Diagnosis not present

## 2015-03-13 NOTE — ED Notes (Signed)
Pt (RN) was with a patient who grabbed a hold of her right hand and twisted the hand. Swelling noted to medial side of the top of the hand near the index finger. Full ROM with all fingers. PA with patient.

## 2015-03-13 NOTE — ED Provider Notes (Signed)
CSN: [id number]     Arrival date & time 03/13/15  2048 History   First MD Initiated Contact with Patient 03/13/15 2058     Chief Complaint  Patient presents with  . Finger Injury     (Consider location/radiation/quality/duration/timing/severity/associated sxs/prior Treatment) HPI Patient got her hand twisted by a patient.  She is a nurse here in the emergency department.  She is complaining of pain at the base of the second digit on the dorsum of her hand.  The patient states that movement and palpation make the pain worse.  She denies numbness or weakness in the hand Past Medical History  Diagnosis Date  . Migraines     since 1998  . Plantar fasciitis   . GERD (gastroesophageal reflux disease)    Past Surgical History  Procedure Laterality Date  . Cesarean section  12/26/07, 10/31/04  . Tubal ligation  12/2007  . Abdominal cyst removed  04/2002  . Breath tek h pylori  09/18/2011    Procedure: BREATH TEK H PYLORI;  Surgeon: Shann Medal, MD;  Location: [facility];  Service: General;  Laterality: N/A;  . 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 No pertinent family history. History  Substance Use Topics  . Smoking status: Never Smoker   . Smokeless tobacco: Never Used  . Alcohol Use: No   OB History    No data available     Review of Systems All other systems negative except as documented in the HPI. All pertinent positives and negatives as reviewed in the HPI.   Allergies  Bee venom  Home Medications   Prior to Admission medications   Medication Sig Start Date End Date Taking? Authorizing Provider  Calcium Carbonate (CALCIUM 600 PO) Take 1 tablet by mouth 3 (three) times daily.   Yes Historical Provider, MD  ibuprofen (ADVIL,MOTRIN) 200 MG tablet Take 600-800 mg by mouth every 6 (six) hours as needed for moderate pain.   Yes  Historical Provider, MD  Multiple Vitamin (MULTIVITAMIN WITH MINERALS) TABS tablet Take 1 tablet by mouth daily.   Yes Historical Provider, MD  oxyCODONE-acetaminophen (PERCOCET/ROXICET) 5-325 MG per tablet Take 1 tablet by mouth every 4 (four) hours as needed for severe pain. For migraine pain   Yes Historical Provider, MD  rizatriptan (MAXALT) 10 MG tablet Take 10 mg by mouth as needed. For migraine. May repeat in 2 hours if needed   Yes Historical Provider, MD  EPINEPHrine (EPI-PEN) 0.3 mg/0.3 mL DEVI Inject 0.3 mg into the muscle as needed. For beestings    Historical Provider, MD   BP 117/77 mmHg  Pulse 60  Temp(Src) 98.1 F (36.7 C) (Oral)  Resp 18  Ht 5\' 5"  (1.651 m)  Wt 162 lb (73.483 kg)  BMI 26.96 kg/m2  SpO2 100%  LMP 03/12/2015 (Exact Date) Physical Exam  Constitutional: She appears well-developed and well-nourished.  Pulmonary/Chest: Effort normal.  Musculoskeletal:       Hands: Skin: Skin is warm and dry.    ED Course  Procedures (including critical care time) Labs Review Labs Reviewed - No data to display  Imaging Review Dg Hand Complete Right  03/13/2015   CLINICAL DATA:  Patient's hand was squeezed, now with first metacarpal pain  EXAM: RIGHT HAND - COMPLETE 3+ VIEW  COMPARISON:  None.  FINDINGS: There is no evidence of fracture or dislocation.  There is no evidence of arthropathy or other focal bone abnormality. Soft tissues are unremarkable.  IMPRESSION: Negative.   Electronically Signed   By: Andreas Newport M.D.   On: 03/13/2015 21:45    The patient has a contusion of the hand.  Told to use ibuprofen and ice.  Return here as needed  Dalia Heading, PA-C 03/13/15 2157  Francine Graven, DO 03/17/15 1645

## 2015-08-08 DIAGNOSIS — D649 Anemia, unspecified: Secondary | ICD-10-CM

## 2015-08-08 DIAGNOSIS — K449 Diaphragmatic hernia without obstruction or gangrene: Secondary | ICD-10-CM

## 2015-08-08 HISTORY — DX: Anemia, unspecified: D64.9

## 2015-08-08 HISTORY — DX: Diaphragmatic hernia without obstruction or gangrene: K44.9

## 2015-08-23 MED FILL — PROMETHAZINE 12.5 MG TABLET: 12.5 | 10 days supply | Qty: 10 | Fill #0

## 2015-08-23 MED FILL — HYDROCODON-APAP 5-325: 5-325 | 4 days supply | Qty: 25 | Fill #0

## 2015-08-23 MED FILL — EPINEPHRINE 0.3 MG AUTO-INJ: 0.3 | 30 days supply | Qty: 2 | Fill #1

## 2015-08-23 MED FILL — RIZATRIPTAN 10 MG TABLET: 10 | 30 days supply | Qty: 9 | Fill #1

## 2015-08-23 MED FILL — CEPHALEXIN 500 MG CAPSULE: 500 | 3 days supply | Qty: 9 | Fill #0

## 2016-06-07 DIAGNOSIS — Z87442 Personal history of urinary calculi: Secondary | ICD-10-CM

## 2016-06-07 HISTORY — DX: Personal history of urinary calculi: Z87.442

## 2016-06-14 ENCOUNTER — Emergency Department (HOSPITAL_COMMUNITY): Payer: 59

## 2016-06-14 ENCOUNTER — Emergency Department (HOSPITAL_COMMUNITY)
Admission: EM | Admit: 2016-06-14 | Discharge: 2016-06-15 | Disposition: A | Discharge status: Home / Self Care | Payer: 59 | Attending: Emergency Medicine | Admitting: Emergency Medicine

## 2016-06-14 ENCOUNTER — Encounter (HOSPITAL_COMMUNITY): Payer: Self-pay | Admitting: Emergency Medicine

## 2016-06-14 DIAGNOSIS — D509 Iron deficiency anemia, unspecified: Secondary | ICD-10-CM | POA: Diagnosis not present

## 2016-06-14 DIAGNOSIS — N200 Calculus of kidney: Secondary | ICD-10-CM

## 2016-06-14 DIAGNOSIS — D259 Leiomyoma of uterus, unspecified: Secondary | ICD-10-CM | POA: Diagnosis not present

## 2016-06-14 DIAGNOSIS — R1031 Right lower quadrant pain: Secondary | ICD-10-CM | POA: Diagnosis not present

## 2016-06-14 DIAGNOSIS — R1011 Right upper quadrant pain: Secondary | ICD-10-CM | POA: Diagnosis not present

## 2016-06-14 DIAGNOSIS — N132 Hydronephrosis with renal and ureteral calculous obstruction: Secondary | ICD-10-CM | POA: Diagnosis not present

## 2016-06-14 DIAGNOSIS — Z79899 Other long term (current) drug therapy: Secondary | ICD-10-CM | POA: Insufficient documentation

## 2016-06-14 DIAGNOSIS — R102 Pelvic and perineal pain: Secondary | ICD-10-CM

## 2016-06-14 LAB — CBC WITH DIFFERENTIAL/PLATELET
Basophils Absolute: 0.1 10*3/uL (ref 0.0–0.1)
Basophils Relative: 1 %
EOS ABS: 0.1 10*3/uL (ref 0.0–0.7)
Eosinophils Relative: 1 %
HCT: 26.5 % — ABNORMAL LOW (ref 36.0–46.0)
Hemoglobin: 7.8 g/dL — ABNORMAL LOW (ref 12.0–15.0)
LYMPHS ABS: 1.5 10*3/uL (ref 0.7–4.0)
Lymphocytes Relative: 16 %
MCH: 21.2 pg — AB (ref 26.0–34.0)
MCHC: 29.4 g/dL — AB (ref 30.0–36.0)
MCV: 72 fL — ABNORMAL LOW (ref 78.0–100.0)
MONO ABS: 0.5 10*3/uL (ref 0.1–1.0)
Monocytes Relative: 5 %
NEUTROS ABS: 7.2 10*3/uL (ref 1.7–7.7)
Neutrophils Relative %: 77 %
Platelets: 299 10*3/uL (ref 150–400)
RBC: 3.68 MIL/uL — ABNORMAL LOW (ref 3.87–5.11)
RDW: 16.2 % — ABNORMAL HIGH (ref 11.5–15.5)
WBC: 9.4 10*3/uL (ref 4.0–10.5)

## 2016-06-14 LAB — BASIC METABOLIC PANEL
Anion gap: 3 — ABNORMAL LOW (ref 5–15)
BUN: 15 mg/dL (ref 6–20)
CHLORIDE: 115 mmol/L — AB (ref 101–111)
CO2: 21 mmol/L — ABNORMAL LOW (ref 22–32)
CREATININE: 1.02 mg/dL — AB (ref 0.44–1.00)
Calcium: 8.7 mg/dL — ABNORMAL LOW (ref 8.9–10.3)
GFR calc Af Amer: >60 mL/min (ref >60)
GFR calc non Af Amer: >60 mL/min (ref >60)
Glucose, Bld: 122 mg/dL — ABNORMAL HIGH (ref 65–99)
Potassium: 3.9 mmol/L (ref 3.5–5.1)
SODIUM: 139 mmol/L (ref 135–145)

## 2016-06-14 LAB — I-STAT BETA HCG BLOOD, ED (MC, WL, AP ONLY): I-stat hCG, quantitative: <5 m[IU]/mL (ref <5)

## 2016-06-14 MED ORDER — SODIUM CHLORIDE 0.9 % IV BOLUS (SEPSIS)
1000.0000 mL | Freq: Once | INTRAVENOUS | Status: AC
  Administered 2016-06-14: 1000 mL via INTRAVENOUS

## 2016-06-14 MED ORDER — KETOROLAC TROMETHAMINE 30 MG/ML IJ SOLN
30.0000 mg | Freq: Once | INTRAMUSCULAR | Status: AC
  Administered 2016-06-14: 30 mg via INTRAVENOUS
  Filled 2016-06-14: qty 1

## 2016-06-14 MED ORDER — HYDROMORPHONE HCL 1 MG/ML IJ SOLN
1.0000 mg | Freq: Once | INTRAMUSCULAR | Status: AC
  Administered 2016-06-14: 1 mg via INTRAVENOUS
  Filled 2016-06-14: qty 1

## 2016-06-14 MED ORDER — ONDANSETRON HCL 4 MG/2ML IJ SOLN
4.0000 mg | Freq: Once | INTRAMUSCULAR | Status: AC
  Administered 2016-06-14: 4 mg via INTRAVENOUS
  Filled 2016-06-14: qty 2

## 2016-06-14 NOTE — ED Notes (Signed)
Pelvic cart at bedside. 

## 2016-06-14 NOTE — ED Provider Notes (Signed)
[facility] DEPT Provider Note   CSN: [id number] Arrival date & time: 06/14/16  2207  By signing my name below, I, Julien Nordmann, attest that this documentation has been prepared under the direction and in the presence of [employer], PA-C.  Electronically Signed: Julien Nordmann, ED Scribe. 06/14/16. 10:27 PM.    History   Chief Complaint Chief Complaint  Patient presents with  . Abdominal Pain  . Emesis    The history is provided by the patient. No language interpreter was used.   HPI Comments: Suzanne Green is a 40 y.o. female who has a PMhx of GERD presents to the Emergency Department complaining of intermittent, gradual worsening, sharp, RUQ abdominal pain x 2 weeks. She reports associated nausea, vomiting, and diaphoresis. Pt states that the pain radiates into her right lower back. She has hx of frequent ovarian cysts in the past and she thought that she her pain was due to that. Pt notes that earlier today she has been having intermittent nausea and vomiting which is out of the ordinary. Pain is worse with movement. Past abdominal surgeries of two cesarean sections, gastric bypass, and removal of ovarian cysts. She has not taken any medication to alleviate her pain. Denies fever, dysuria, hematuria, or abnormal vaginal bleeding. She is currently on her menstrual cycle.   Past Medical History:  Diagnosis Date  . GERD (gastroesophageal reflux disease)   . Migraines    since 1998  . Plantar fasciitis     Patient Active Problem List   Diagnosis Date Noted  . History of gastric bypass, 12/19/2011. 01/12/2012  . Morbid obesity, Weight 279, BMI - 47.2. 08/24/2011    Past Surgical History:  Procedure Laterality Date  . abdominal cyst removed  04/2002  . BREATH TEK H PYLORI  09/18/2011   Procedure: BREATH TEK H PYLORI;  Surgeon: Shann Medal, MD;  Location: [facility];  Service: General;  Laterality: N/A;  . CESAREAN SECTION  12/26/07, 10/31/04  . GASTRIC ROUX-EN-Y   12/19/2011   Procedure: LAPAROSCOPIC ROUX-EN-Y GASTRIC;  Surgeon: Shann Medal, MD;  Location: [facility];  Service: General;  Laterality: N/A;  . TUBAL LIGATION  12/2007    OB History    No data available       Home Medications    Prior to Admission medications   Medication Sig Start Date End Date Taking? Authorizing Provider  Calcium Carbonate (CALCIUM 600 PO) Take 1 tablet by mouth 3 (three) times daily.    Historical Provider, MD  EPINEPHrine (EPI-PEN) 0.3 mg/0.3 mL DEVI Inject 0.3 mg into the muscle as needed. For beestings    Historical Provider, MD  ibuprofen (ADVIL,MOTRIN) 600 MG tablet Take 1 tablet (600 mg total) by mouth every 6 (six) hours as needed. 06/15/16   Antonietta Breach, PA-C  Multiple Vitamin (MULTIVITAMIN WITH MINERALS) TABS tablet Take 1 tablet by mouth daily.    Historical Provider, MD  ondansetron (ZOFRAN ODT) 4 MG disintegrating tablet Take 1 tablet (4 mg total) by mouth every 8 (eight) hours as needed for nausea or vomiting. 06/15/16   Antonietta Breach, PA-C  oxyCODONE-acetaminophen (PERCOCET/ROXICET) 5-325 MG tablet Take 1-2 tablets by mouth every 6 (six) hours as needed for severe pain. 06/15/16   Antonietta Breach, PA-C  rizatriptan (MAXALT) 10 MG tablet Take 10 mg by mouth as needed. For migraine. May repeat in 2 hours if needed    Historical Provider, MD  tamsulosin (FLOMAX) 0.4 MG CAPS capsule Take 1 capsule (0.4 mg total) by  mouth daily. 06/15/16   Antonietta Breach, PA-C    Family History No family history on file.  Social History Social History  Substance Use Topics  . Smoking status: Never Smoker  . Smokeless tobacco: Never Used  . Alcohol use No     Allergies   Bee venom   Review of Systems Review of Systems  Gastrointestinal: Positive for abdominal pain.  Genitourinary: Positive for vaginal bleeding (menstrual cycle). Negative for dysuria, hematuria and vaginal discharge.  A complete 10 system review of systems was obtained and all systems are negative except as  noted in the HPI and PMH.     Physical Exam Updated Vital Signs BP 109/65 (BP Location: Left Arm)   Pulse 77   Temp 97.8 F (36.6 C) (Oral)   Resp 18   Ht 5\' 5"  (1.651 m)   Wt 81.2 kg   SpO2 100%   BMI 29.79 kg/m   Physical Exam  Constitutional: She is oriented to person, place, and time. She appears well-developed and well-nourished. No distress.  Pale, actively vomiting  HENT:  Head: Normocephalic and atraumatic.  Eyes: Conjunctivae and EOM are normal. No scleral icterus.  Neck: Normal range of motion.  Cardiovascular: Normal rate, regular rhythm and intact distal pulses.   Pulmonary/Chest: Effort normal. No respiratory distress. She has no wheezes.  Respirations even and unlabored. Lungs CTAB.  Abdominal: Soft. She exhibits no mass. There is tenderness. There is no guarding.  Soft abdomen with RLQ TTP. No peritoneal signs.  Genitourinary: There is no rash, tenderness or lesion on the right labia. There is no rash, tenderness or lesion on the left labia. Uterus is not tender. Cervix exhibits no motion tenderness and no friability. Right adnexum displays tenderness. Right adnexum displays no mass. Left adnexum displays no mass and no tenderness. There is bleeding (c/w menses) in the vagina. No signs of injury around the vagina.  Musculoskeletal: Normal range of motion.  Neurological: She is alert and oriented to person, place, and time. She exhibits normal muscle tone. Coordination normal.  Skin: Skin is warm and dry. No rash noted. She is not diaphoretic. No erythema. No pallor.  Psychiatric: She has a normal mood and affect. Her behavior is normal.  Nursing note and vitals reviewed.    ED Treatments / Results  DIAGNOSTIC STUDIES: Oxygen Saturation is 100% on RA, normal by my interpretation.  COORDINATION OF CARE:  10:25 PM Discussed treatment plan with pt at bedside and pt agreed to plan.  Labs (all labs ordered are listed, but only abnormal results are  displayed) Labs Reviewed  WET PREP, GENITAL - Abnormal; Notable for the following:       Result Value   WBC, Wet Prep HPF POC FEW (*)    All other components within normal limits  CBC WITH DIFFERENTIAL/PLATELET - Abnormal; Notable for the following:    RBC 3.68 (*)    Hemoglobin 7.8 (*)    HCT 26.5 (*)    MCV 72.0 (*)    MCH 21.2 (*)    MCHC 29.4 (*)    RDW 16.2 (*)    All other components within normal limits  BASIC METABOLIC PANEL - Abnormal; Notable for the following:    Chloride 115 (*)    CO2 21 (*)    Glucose, Bld 122 (*)    Creatinine, Ser 1.02 (*)    Calcium 8.7 (*)    Anion gap 3 (*)    All other components within normal limits  URINALYSIS,  ROUTINE W REFLEX MICROSCOPIC (NOT AT [facility]) - Abnormal; Notable for the following:    APPearance CLOUDY (*)    Hgb urine dipstick LARGE (*)    All other components within normal limits  URINE MICROSCOPIC-ADD ON - Abnormal; Notable for the following:    Squamous Epithelial / LPF 0-5 (*)    Bacteria, UA RARE (*)    All other components within normal limits  I-STAT BETA HCG BLOOD, ED ([facility], [facility], [facility] ONLY)  TYPE AND SCREEN  ABO/RH  GC/CHLAMYDIA PROBE AMP ([place]) NOT AT [facility]    EKG  EKG Interpretation None       Radiology US Pelvis Complete  Result Date: 06/14/2016 CLINICAL DATA:  Right lower quadrant pain and nausea/ vomiting for 2 weeks. EXAM: TRANSABDOMINAL AND TRANSVAGINAL ULTRASOUND OF PELVIS DOPPLER ULTRASOUND OF OVARIES TECHNIQUE: Both transabdominal and transvaginal ultrasound examinations of the pelvis were performed. Transabdominal technique was performed for global imaging of the pelvis including uterus, ovaries, adnexal regions, and pelvic cul-de-sac. It was necessary to proceed with endovaginal exam following the transabdominal exam to visualize the ovaries in uterus. Color and duplex Doppler ultrasound was utilized to evaluate blood flow to the ovaries. COMPARISON:  CT abdomen pelvis 09/18/2014 FINDINGS: Uterus  Measurements: 6.0 x 6.2 x 5.9 cm. The uterus is retroflexed. There is a fibroid measuring 2.4 x 1.7 x 2.6 cm at the posterior left aspect of the uterine corpus. There is a nabothian cyst measuring 0.8 x 0.6 x 0.9 cm. Endometrium Thickness: 5.1 mm.  No focal abnormality visualized. Right ovary Measurements: 3.7 x 1.7 x 2.2 cm. Normal appearance/no adnexal mass. Left ovary Measurements: 3.4 x 2.8 x 2.7 cm. Normal appearance/no adnexal mass. Pulsed Doppler evaluation of both ovaries demonstrates normal low-resistance arterial and venous waveforms. Other findings No abnormal free fluid. IMPRESSION: 1. No evidence of ovarian torsion during the examination. Normal appearance of both ovaries. 2. Uterine fibroid measuring up to 2.6 cm. Electronically Signed   By: Ulyses Jarred M.D.   On: 06/14/2016 23:57   Ct Abdomen Pelvis W Contrast  Result Date: 06/15/2016 CLINICAL DATA:  Right lower quadrant pain radiating to the lower back EXAM: CT ABDOMEN AND PELVIS WITH CONTRAST TECHNIQUE: Multidetector CT imaging of the abdomen and pelvis was performed using the standard protocol following bolus administration of intravenous contrast. CONTRAST:  190mL ISOVUE-300 IOPAMIDOL (ISOVUE-300) INJECTION 61% COMPARISON:  CT abdomen pelvis 09/18/2014 FINDINGS: Lower chest: No pulmonary nodules. No visible pleural or pericardial effusion. Bilateral breast implants. Hepatobiliary: Normal hepatic size and contours without focal liver lesion. No perihepatic ascites. No intra- or extrahepatic biliary dilatation. Normal gallbladder. Pancreas: Normal pancreatic contours and enhancement. No peripancreatic fluid collection or pancreatic ductal dilatation. Spleen: Normal. Adrenals/Urinary Tract: Normal adrenal glands. Left renal cyst measures 1.5 cm. There is right hydronephrosis and perinephric stranding with a 4 mm calculus within the proximal right ureter at the ureteropelvic junction. Stomach/Bowel: Status post gastric bypass. No evidence of  acute GI inflammation. No dilated small bowel. Anastomotic suture line noted in the left abdomen. Large amount of stool within the hepatic flexure. Appendix not visualized. No abdominal fluid collection. No pneumobilia or portal venous gas. Small hiatal hernia. Vascular/Lymphatic: Normal course and caliber of the major abdominal vessels. No abdominal or pelvic adenopathy. Reproductive: Uterus and ovaries are normal. Musculoskeletal: No lytic or blastic osseous lesion. Normal visualized extrathoracic and extraperitoneal soft tissues. Other: No contributory non-categorized findings. IMPRESSION: 1. Right obstructive uropathy with 4 mm calculus at the right ureteropelvic junction and resultant  moderate right hydronephrosis and mild perinephric stranding. 2. Status post gastric bypass.  Small hiatal hernia. Electronically Signed   By: Ulyses Jarred M.D.   On: 06/15/2016 01:06   [place] Art/ven Flow Abd Pelv Doppler  Result Date: 06/14/2016 CLINICAL DATA:  Right lower quadrant pain and nausea/ vomiting for 2 weeks. EXAM: TRANSABDOMINAL AND TRANSVAGINAL ULTRASOUND OF PELVIS DOPPLER ULTRASOUND OF OVARIES TECHNIQUE: Both transabdominal and transvaginal ultrasound examinations of the pelvis were performed. Transabdominal technique was performed for global imaging of the pelvis including uterus, ovaries, adnexal regions, and pelvic cul-de-sac. It was necessary to proceed with endovaginal exam following the transabdominal exam to visualize the ovaries in uterus. Color and duplex Doppler ultrasound was utilized to evaluate blood flow to the ovaries. COMPARISON:  CT abdomen pelvis 09/18/2014 FINDINGS: Uterus Measurements: 6.0 x 6.2 x 5.9 cm. The uterus is retroflexed. There is a fibroid measuring 2.4 x 1.7 x 2.6 cm at the posterior left aspect of the uterine corpus. There is a nabothian cyst measuring 0.8 x 0.6 x 0.9 cm. Endometrium Thickness: 5.1 mm.  No focal abnormality visualized. Right ovary Measurements: 3.7 x 1.7 x 2.2 cm.  Normal appearance/no adnexal mass. Left ovary Measurements: 3.4 x 2.8 x 2.7 cm. Normal appearance/no adnexal mass. Pulsed Doppler evaluation of both ovaries demonstrates normal low-resistance arterial and venous waveforms. Other findings No abnormal free fluid. IMPRESSION: 1. No evidence of ovarian torsion during the examination. Normal appearance of both ovaries. 2. Uterine fibroid measuring up to 2.6 cm. Electronically Signed   By: Ulyses Jarred M.D.   On: 06/14/2016 23:57    Procedures Procedures (including critical care time)  Medications Ordered in ED Medications  ondansetron (ZOFRAN) injection 4 mg (4 mg Intravenous Given 06/14/16 2229)  HYDROmorphone (DILAUDID) injection 1 mg (1 mg Intravenous Given 06/14/16 2229)  ketorolac (TORADOL) 30 MG/ML injection 30 mg (30 mg Intravenous Given 06/14/16 2229)  sodium chloride 0.9 % bolus 1,000 mL (0 mLs Intravenous Stopped 06/15/16 0013)  HYDROmorphone (DILAUDID) injection 1 mg (1 mg Intravenous Given 06/14/16 2314)  iopamidol (ISOVUE-300) 61 % injection 100 mL (100 mLs Intravenous Contrast Given 06/15/16 0047)  sodium chloride 0.9 % bolus 1,000 mL (0 mLs Intravenous Stopped 06/15/16 0215)  oxyCODONE-acetaminophen (PERCOCET/ROXICET) 5-325 MG per tablet 2 tablet (2 tablets Oral Given 06/15/16 0208)     Initial Impression / Assessment and Plan / ED Course  I have reviewed the triage vital signs and the nursing notes.  Pertinent labs & imaging results that were available during my care of the patient were reviewed by me and considered in my medical decision making (see chart for details).  Clinical Course     Pt has been diagnosed with a kidney stone via CT. Moderate hydronephrosis noted, though kidney function is largely preserved. No UTI. Pain controlled with Toradol and Dilaudid. Zofran given for nausea without further emesis. Patient is anemic, though reporting baseline hemoglobin of 9. There is no tachycardia or hypotension to suggest acute,  emergent blood loss. Anemia may be slightly worse secondary to menses. Patient told to follow up on this with her primary care doctor for recheck. Pt will be discharged home with pain medications and has been advised to follow up with urology. Return precautions discussed and provided. Patient discharged in stable condition with no unaddressed concerns.   I personally performed the services described in this documentation, which was scribed in my presence. The recorded information has been reviewed and is accurate.    Final Clinical Impressions(s) / ED Diagnoses  Final diagnoses:  Kidney stone on right side  Right lower quadrant abdominal pain  Microcytic anemia    New Prescriptions New Prescriptions   IBUPROFEN (ADVIL,MOTRIN) 600 MG TABLET    Take 1 tablet (600 mg total) by mouth every 6 (six) hours as needed.   ONDANSETRON (ZOFRAN ODT) 4 MG DISINTEGRATING TABLET    Take 1 tablet (4 mg total) by mouth every 8 (eight) hours as needed for nausea or vomiting.   OXYCODONE-ACETAMINOPHEN (PERCOCET/ROXICET) 5-325 MG TABLET    Take 1-2 tablets by mouth every 6 (six) hours as needed for severe pain.   TAMSULOSIN (FLOMAX) 0.4 MG CAPS CAPSULE    Take 1 capsule (0.4 mg total) by mouth daily.     Antonietta Breach, PA-C 06/15/16 0226    Antonietta Breach, PA-C 06/15/16 [id number]    Orlie Dakin, MD 06/15/16 [phone]

## 2016-06-14 NOTE — ED Triage Notes (Signed)
Pt c/o severe lower right side abdominal pain radiating to lower back with emesis. Pt hx of ovarian cyst.

## 2016-06-14 NOTE — ED Notes (Signed)
Bed: WA14 Expected date:  Expected time:  Means of arrival:  Comments: 

## 2016-06-14 NOTE — ED Notes (Addendum)
Pt complains of 8/10 sharp abdominal pain.

## 2016-06-15 ENCOUNTER — Encounter (HOSPITAL_COMMUNITY): Payer: Self-pay

## 2016-06-15 ENCOUNTER — Emergency Department (HOSPITAL_COMMUNITY): Payer: 59

## 2016-06-15 DIAGNOSIS — D509 Iron deficiency anemia, unspecified: Secondary | ICD-10-CM | POA: Diagnosis not present

## 2016-06-15 DIAGNOSIS — Z79899 Other long term (current) drug therapy: Secondary | ICD-10-CM | POA: Diagnosis not present

## 2016-06-15 DIAGNOSIS — R1011 Right upper quadrant pain: Secondary | ICD-10-CM | POA: Diagnosis not present

## 2016-06-15 DIAGNOSIS — N132 Hydronephrosis with renal and ureteral calculous obstruction: Secondary | ICD-10-CM | POA: Diagnosis not present

## 2016-06-15 DIAGNOSIS — D259 Leiomyoma of uterus, unspecified: Secondary | ICD-10-CM | POA: Diagnosis not present

## 2016-06-15 DIAGNOSIS — N200 Calculus of kidney: Secondary | ICD-10-CM | POA: Diagnosis not present

## 2016-06-15 LAB — URINALYSIS, ROUTINE W REFLEX MICROSCOPIC
Bilirubin Urine: NEGATIVE
Glucose, UA: NEGATIVE mg/dL
KETONES UR: NEGATIVE mg/dL
LEUKOCYTES UA: NEGATIVE
NITRITE: NEGATIVE
PROTEIN: NEGATIVE mg/dL
Specific Gravity, Urine: 1.021 (ref 1.005–1.030)
pH: 5.5 (ref 5.0–8.0)

## 2016-06-15 LAB — WET PREP, GENITAL
CLUE CELLS WET PREP: NONE SEEN
Sperm: NONE SEEN
TRICH WET PREP: NONE SEEN
YEAST WET PREP: NONE SEEN

## 2016-06-15 LAB — URINE MICROSCOPIC-ADD ON

## 2016-06-15 LAB — GC/CHLAMYDIA PROBE AMP (~~LOC~~) NOT AT ARMC
Chlamydia: NEGATIVE
Neisseria Gonorrhea: NEGATIVE

## 2016-06-15 LAB — ABO/RH: ABO/RH(D): A POS

## 2016-06-15 LAB — TYPE AND SCREEN
ABO/RH(D): A POS
ANTIBODY SCREEN: NEGATIVE

## 2016-06-15 MED ORDER — IOPAMIDOL (ISOVUE-300) INJECTION 61%
100.0000 mL | Freq: Once | INTRAVENOUS | Status: AC | PRN
  Administered 2016-06-15: 100 mL via INTRAVENOUS

## 2016-06-15 MED ORDER — TAMSULOSIN HCL 0.4 MG PO CAPS: 0.4000 mg | ORAL_CAPSULE | Freq: Every day | ORAL | 0 refills | Status: DC

## 2016-06-15 MED ORDER — SODIUM CHLORIDE 0.9 % IV BOLUS (SEPSIS)
1000.0000 mL | Freq: Once | INTRAVENOUS | Status: AC
  Administered 2016-06-15: 1000 mL via INTRAVENOUS

## 2016-06-15 MED ORDER — IBUPROFEN 600 MG PO TABS: 600.0000 mg | ORAL_TABLET | Freq: Four times a day (QID) | ORAL | 0 refills | Status: DC | PRN

## 2016-06-15 MED ORDER — OXYCODONE-ACETAMINOPHEN 5-325 MG PO TABS
2.0000 | ORAL_TABLET | Freq: Once | ORAL | Status: AC
  Administered 2016-06-15: 2 via ORAL
  Filled 2016-06-15: qty 2

## 2016-06-15 MED ORDER — ONDANSETRON 4 MG PO TBDP: 4.0000 mg | ORAL_TABLET | Freq: Three times a day (TID) | ORAL | 0 refills | Status: DC | PRN

## 2016-06-15 MED ORDER — OXYCODONE-ACETAMINOPHEN 5-325 MG PO TABS: 1.0000 | ORAL_TABLET | Freq: Four times a day (QID) | ORAL | 0 refills | Status: DC | PRN

## 2016-06-15 NOTE — ED Provider Notes (Signed)
Complains of right lower quadrant pain waxing and waning for approximately 2 weeks becoming worse tonight. She denies any fever pain sometimes radiates to right flank. She is currently on her menses. On exam alert nontoxic lungs clear auscultation heart regular rate and rhythm. Abdomen normoactive bowel sounds tender right lower quadrant without guarding rigidity or rebound. There is mild right flank tenderness   Orlie Dakin, MD 06/15/16 0030

## 2016-06-15 NOTE — Discharge Instructions (Signed)
Take Flomax as prescribed to promote stone movement. Take ibuprofen for pain control and Percocet as needed for severe pain. You may take Zofran as needed for nausea. Follow-up with urology regarding your kidney stone. You may return for new or concerning symptoms.

## 2016-06-15 NOTE — ED Notes (Signed)
Assisted PA with pelvic exam ?

## 2016-06-22 DIAGNOSIS — N1330 Unspecified hydronephrosis: Secondary | ICD-10-CM | POA: Diagnosis not present

## 2016-06-22 DIAGNOSIS — N201 Calculus of ureter: Secondary | ICD-10-CM | POA: Diagnosis not present

## 2016-06-22 DIAGNOSIS — R1084 Generalized abdominal pain: Secondary | ICD-10-CM | POA: Diagnosis not present

## 2016-06-22 MED FILL — TAMSULOSIN HCL 0.4 MG CAP: 0.4 | 30 days supply | Qty: 30 | Fill #0

## 2016-06-22 MED FILL — OXYCODONE/APAP 5/325 MG TAB: 5-325 | 3 days supply | Qty: 30 | Fill #0

## 2016-07-07 DIAGNOSIS — N201 Calculus of ureter: Secondary | ICD-10-CM | POA: Diagnosis not present

## 2016-07-18 ENCOUNTER — Encounter (HOSPITAL_COMMUNITY): Payer: Self-pay

## 2016-07-19 DIAGNOSIS — N201 Calculus of ureter: Secondary | ICD-10-CM | POA: Diagnosis not present

## 2016-08-07 DIAGNOSIS — C801 Malignant (primary) neoplasm, unspecified: Secondary | ICD-10-CM

## 2016-08-07 HISTORY — DX: Malignant (primary) neoplasm, unspecified: C80.1

## 2016-11-24 DIAGNOSIS — J209 Acute bronchitis, unspecified: Secondary | ICD-10-CM | POA: Diagnosis not present

## 2017-02-06 DIAGNOSIS — R1310 Dysphagia, unspecified: Secondary | ICD-10-CM | POA: Diagnosis not present

## 2017-02-06 DIAGNOSIS — Z1389 Encounter for screening for other disorder: Secondary | ICD-10-CM | POA: Diagnosis not present

## 2017-02-06 DIAGNOSIS — D649 Anemia, unspecified: Secondary | ICD-10-CM | POA: Diagnosis not present

## 2017-02-06 DIAGNOSIS — Z6832 Body mass index (BMI) 32.0-32.9, adult: Secondary | ICD-10-CM | POA: Diagnosis not present

## 2017-02-06 DIAGNOSIS — Z91038 Other insect allergy status: Secondary | ICD-10-CM | POA: Diagnosis not present

## 2017-02-06 DIAGNOSIS — G43511 Persistent migraine aura without cerebral infarction, intractable, with status migrainosus: Secondary | ICD-10-CM | POA: Diagnosis not present

## 2017-02-06 MED FILL — EPINEPHRINE 0.3 MG AUTO-INJ: 0.3 | 30 days supply | Qty: 2 | Fill #0

## 2017-02-06 MED FILL — RIZATRIPTAN 10 MG TABLET: 10 | 15 days supply | Qty: 9 | Fill #0

## 2017-02-19 DIAGNOSIS — I83893 Varicose veins of bilateral lower extremities with other complications: Secondary | ICD-10-CM | POA: Diagnosis not present

## 2017-02-19 DIAGNOSIS — I83813 Varicose veins of bilateral lower extremities with pain: Secondary | ICD-10-CM | POA: Diagnosis not present

## 2017-02-27 DIAGNOSIS — R1310 Dysphagia, unspecified: Secondary | ICD-10-CM | POA: Diagnosis not present

## 2017-02-27 DIAGNOSIS — E041 Nontoxic single thyroid nodule: Secondary | ICD-10-CM | POA: Diagnosis not present

## 2017-02-28 DIAGNOSIS — E049 Nontoxic goiter, unspecified: Secondary | ICD-10-CM | POA: Diagnosis not present

## 2017-02-28 DIAGNOSIS — R1310 Dysphagia, unspecified: Secondary | ICD-10-CM | POA: Diagnosis not present

## 2017-02-28 DIAGNOSIS — E041 Nontoxic single thyroid nodule: Secondary | ICD-10-CM | POA: Diagnosis not present

## 2017-03-12 DIAGNOSIS — I83893 Varicose veins of bilateral lower extremities with other complications: Secondary | ICD-10-CM | POA: Diagnosis not present

## 2017-03-29 DIAGNOSIS — I83813 Varicose veins of bilateral lower extremities with pain: Secondary | ICD-10-CM | POA: Diagnosis not present

## 2017-04-06 DIAGNOSIS — C73 Malignant neoplasm of thyroid gland: Secondary | ICD-10-CM | POA: Diagnosis not present

## 2017-04-06 DIAGNOSIS — R1310 Dysphagia, unspecified: Secondary | ICD-10-CM | POA: Diagnosis not present

## 2017-04-06 DIAGNOSIS — E041 Nontoxic single thyroid nodule: Secondary | ICD-10-CM | POA: Diagnosis not present

## 2017-05-04 DIAGNOSIS — D649 Anemia, unspecified: Secondary | ICD-10-CM | POA: Diagnosis not present

## 2017-05-04 DIAGNOSIS — C73 Malignant neoplasm of thyroid gland: Secondary | ICD-10-CM | POA: Diagnosis not present

## 2017-05-04 DIAGNOSIS — Z6832 Body mass index (BMI) 32.0-32.9, adult: Secondary | ICD-10-CM | POA: Diagnosis not present

## 2017-05-09 DIAGNOSIS — C73 Malignant neoplasm of thyroid gland: Secondary | ICD-10-CM | POA: Diagnosis not present

## 2017-05-17 DIAGNOSIS — D509 Iron deficiency anemia, unspecified: Secondary | ICD-10-CM | POA: Diagnosis not present

## 2017-05-17 DIAGNOSIS — Z9884 Bariatric surgery status: Secondary | ICD-10-CM | POA: Diagnosis not present

## 2017-05-18 DIAGNOSIS — D509 Iron deficiency anemia, unspecified: Secondary | ICD-10-CM | POA: Diagnosis not present

## 2017-05-18 DIAGNOSIS — Z9884 Bariatric surgery status: Secondary | ICD-10-CM | POA: Diagnosis not present

## 2017-05-21 ENCOUNTER — Ambulatory Visit: Payer: Self-pay | Admitting: Surgery

## 2017-05-21 DIAGNOSIS — D440 Neoplasm of uncertain behavior of thyroid gland: Secondary | ICD-10-CM | POA: Diagnosis not present

## 2017-05-22 DIAGNOSIS — D259 Leiomyoma of uterus, unspecified: Secondary | ICD-10-CM | POA: Diagnosis not present

## 2017-05-22 DIAGNOSIS — D509 Iron deficiency anemia, unspecified: Secondary | ICD-10-CM | POA: Diagnosis not present

## 2017-05-22 DIAGNOSIS — N839 Noninflammatory disorder of ovary, fallopian tube and broad ligament, unspecified: Secondary | ICD-10-CM | POA: Diagnosis not present

## 2017-05-25 DIAGNOSIS — D509 Iron deficiency anemia, unspecified: Secondary | ICD-10-CM | POA: Diagnosis not present

## 2017-06-15 NOTE — Patient Instructions (Addendum)
Suzanne Green  06/15/2017   Your procedure is scheduled on: 06-21-17   Report to [facility] Main  Entrance Take [place] Elevators to 3rd floor to [place] at 11:45 AM.   Call this number if you have problems the morning of surgery [phone]    Remember: ONLY 1 PERSON MAY GO WITH YOU TO SHORT STAY TO GET  READY MORNING OF [place].  Do not eat food or drink liquids :After Midnight. You may have a Clear Liquid Diet from Midnight until 7:45 AM. After 7:45 AM, nothing until after surgery.     CLEAR LIQUID DIET   Foods Allowed                                                                     Foods Excluded  Coffee and tea, regular and decaf                             liquids that you cannot  Plain Jell-O in any flavor                                             see through such as: Fruit ices (not with fruit pulp)                                     milk, soups, orange juice  Iced Popsicles                                    All solid food Carbonated beverages, regular and diet                                    Cranberry, grape and apple juices Sports drinks like Gatorade Lightly seasoned clear broth or consume(fat free) Sugar, honey syrup  Sample Menu Breakfast                                Lunch                                     Supper Cranberry juice                    Beef broth                            Chicken broth Jell-O                                     Grape juice  Apple juice Coffee or tea                        Jell-O                                      Popsicle                                                Coffee or tea                        Coffee or tea  _____________________________________________________________________     Take these medicines the morning of surgery with A SIP OF WATER: None              You may not have any metal on your body including hair pins and   piercings  Do not wear jewelry, make-up, lotions, powders or perfumes, deodorant             Do not wear nail polish.  Do not shave  48 hours prior to surgery.               Do not bring valuables to the hospital. [place].  Contacts, dentures or bridgework may not be worn into surgery.  Leave suitcase in the car. After surgery it may be brought to your room.                 Please read over the following fact sheets you were given: _____________________________________________________________________             [facility] - Preparing for Surgery Before surgery, you can play an important role.  Because skin is not sterile, your skin needs to be as free of germs as possible.  You can reduce the number of germs on your skin by washing with CHG (chlorahexidine gluconate) soap before surgery.  CHG is an antiseptic cleaner which kills germs and bonds with the skin to continue killing germs even after washing. Please DO NOT use if you have an allergy to CHG or antibacterial soaps.  If your skin becomes reddened/irritated stop using the CHG and inform your nurse when you arrive at Short Stay. Do not shave (including legs and underarms) for at least 48 hours prior to the first CHG shower.  You may shave your face/neck. Please follow these instructions carefully:  1.  Shower with CHG Soap the night before surgery and the  morning of Surgery.  2.  If you choose to wash your hair, wash your hair first as usual with your  normal  shampoo.  3.  After you shampoo, rinse your hair and body thoroughly to remove the  shampoo.                           4.  Use CHG as you would any other liquid soap.  You can apply chg directly  to the skin and wash                       Gently with  a scrungie or clean washcloth.  5.  Apply the CHG Soap to your body ONLY FROM THE NECK DOWN.   Do not use on face/ open                           Wound or open sores. Avoid  contact with eyes, ears mouth and genitals (private parts).                       Wash face,  Genitals (private parts) with your normal soap.             6.  Wash thoroughly, paying special attention to the area where your surgery  will be performed.  7.  Thoroughly rinse your body with warm water from the neck down.  8.  DO NOT shower/wash with your normal soap after using and rinsing off  the CHG Soap.                9.  Pat yourself dry with a clean towel.            10.  Wear clean pajamas.            11.  Place clean sheets on your bed the night of your first shower and do not  sleep with pets. Day of Surgery : Do not apply any lotions/deodorants the morning of surgery.  Please wear clean clothes to the hospital/surgery center.  FAILURE TO FOLLOW THESE INSTRUCTIONS MAY RESULT IN THE CANCELLATION OF YOUR SURGERY PATIENT SIGNATURE_________________________________  NURSE SIGNATURE__________________________________  ________________________________________________________________________

## 2017-06-18 ENCOUNTER — Encounter (HOSPITAL_COMMUNITY): Payer: Self-pay | Admitting: Surgery

## 2017-06-18 DIAGNOSIS — D440 Neoplasm of uncertain behavior of thyroid gland: Secondary | ICD-10-CM | POA: Diagnosis present

## 2017-06-18 NOTE — H&P (Signed)
General Surgery [facility]  Suzanne Green DOB: Oct 02, 1975 Married / Language: English / Race: White Female   History of Present Illness  The patient is a 41 year old female who presents with a thyroid nodule.  CC: thyroid mass, rule out papillary thyroid carcinoma  Patient is referred by her primary care physician, Dr. Kennith Maes, for evaluation of left thyroid nodule with suspected papillary thyroid carcinoma. Patient first developed a globus sensation and mild dysphagia approximately 10 months ago. She eventually underwent evaluation by ENT including laryngoscopy. Subsequently she underwent a thyroid ultrasound which was performed on February 28, 2017. This showed a normal size thyroid gland. There was a dominant nodule in the left lobe measuring 3.4 cm. Biopsy was recommended. There were smaller nodules in the left lobe and in the right lobe. The largest nodule on the right measured 8 mm. Biopsy was performed and showed a follicular neoplasm suspicious for papillary thyroid carcinoma. Additional testing was not performed. Patient underwent a PET scan of the chest abdomen and pelvis on May 08, 2017. This was to evaluate pulmonary nodules. The thyroid nodule was hypermetabolic. There was also a hypermetabolic mass in the pelvis. There was no sign of pulmonary disease. Patient is scheduled for a pelvic ultrasound tomorrow to better evaluate these findings. There is a family history of hypothyroidism and the patient's mother, grandmother, and sister. There is no family history of thyroid malignancy. Patient has never been on thyroid medication. She has had no prior head or neck surgery. She has had gastric bypass procedure. She is accompanied today by her sister. She works as a Manufacturing systems engineer.   Past Surgical History Breast Augmentation  Bilateral. Cesarean Section - Multiple  Gastric Bypass   Diagnostic Studies History  Colonoscopy   never Mammogram  never Pap Smear  1-5 years ago  Medication History  Multivitamin Adult (Oral) Active. Zantac (150MG  Capsule, Oral) Active. Maxalt (10MG  Tablet, Oral) Active. Iron (Ferrous Sulfate) (325MG  Tablet, Oral) Active. Epi E-Z Pen (1:1000 Device, Injection) Active. Medications Reconciled  Social History Alcohol use  Occasional alcohol use. Caffeine use  Tea. No drug use  Tobacco use  Never smoker.  Family History Cerebrovascular Accident  Mother. Diabetes Mellitus  Mother. Heart Disease  Father. Heart disease in female family member before age 67  Hypertension  Father. Thyroid problems  Mother, Sister.  Pregnancy / Birth History Age at menarche  15 years. Gravida  4 Irregular periods  Length (months) of breastfeeding  12-24 Maternal age  9-20 Para  3  Other Problems Gastroesophageal Reflux Disease  Kidney Stone  Migraine Headache  Thyroid Cancer  Thyroid Disease   Review of Systems  General Present- Fatigue and Weight Gain. Not Present- Appetite Loss, Chills, Fever, Night Sweats and Weight Loss. HEENT Present- Hoarseness and Sore Throat. Not Present- Earache, Hearing Loss, Nose Bleed, Oral Ulcers, Ringing in the Ears, Seasonal Allergies, Sinus Pain, Visual Disturbances, Wears glasses/contact lenses and Yellow Eyes. Respiratory Present- Difficulty Breathing. Not Present- Bloody sputum, Chronic Cough, Snoring and Wheezing. Breast Not Present- Breast Mass, Breast Pain, Nipple Discharge and Skin Changes. Cardiovascular Present- Difficulty Breathing Lying Down and Shortness of Breath. Not Present- Chest Pain, Leg Cramps, Palpitations, Rapid Heart Rate and Swelling of Extremities. Gastrointestinal Not Present- Abdominal Pain, Bloating, Bloody Stool, Change in Bowel Habits, Chronic diarrhea, Constipation, Difficulty Swallowing, Excessive gas, Gets full quickly at meals, Hemorrhoids, Indigestion, Nausea, Rectal Pain and Vomiting. Female  Genitourinary Not Present- Frequency, Nocturia, Painful Urination, Pelvic Pain  and Urgency. Musculoskeletal Not Present- Back Pain, Joint Pain, Joint Stiffness, Muscle Pain, Muscle Weakness and Swelling of Extremities. Neurological Present- Headaches. Not Present- Decreased Memory, Fainting, Numbness, Seizures, Tingling, Tremor, Trouble walking and Weakness. Psychiatric Not Present- Anxiety, Bipolar, Change in Sleep Pattern, Depression, Fearful and Frequent crying. Endocrine Not Present- Cold Intolerance, Excessive Hunger, Hair Changes, Heat Intolerance, Hot flashes and New Diabetes. Hematology Not Present- Blood Thinners, Easy Bruising, Excessive bleeding, Gland problems, HIV and Persistent Infections.  Vitals Weight: 196.6 lb Height: 64in Body Surface Area: 1.94 m Body Mass Index: 33.75 kg/m  Temp.: 97.26F(Oral)  Pulse: 55 (Regular)  BP: 118/72 (Sitting, Right Arm, Standard)  Physical Exam   See vital signs recorded above  GENERAL APPEARANCE Development: normal Nutritional status: normal Gross deformities: none  SKIN Rash, lesions, ulcers: none Induration, erythema: none Nodules: none palpable  EYES Conjunctiva and lids: normal Pupils: equal and reactive Iris: normal bilaterally  EARS, NOSE, MOUTH, THROAT External ears: no lesion or deformity External nose: no lesion or deformity Hearing: grossly normal Lips: no lesion or deformity Dentition: normal for age Oral mucosa: moist  NECK Symmetric: no Trachea: midline Thyroid: Palpable 3 cm nodule mid left thyroid lobe, smooth, mobile, nontender; normal right thyroid lobe without palpable abnormality  CHEST Respiratory effort: normal Retraction or accessory muscle use: no Breath sounds: normal bilaterally Rales, rhonchi, wheeze: none  CARDIOVASCULAR Auscultation: regular rhythm, normal rate Murmurs: none Pulses: carotid and radial pulse 2+ palpable Lower extremity edema: none Lower extremity  varicosities: none  MUSCULOSKELETAL Station and gait: normal Digits and nails: no clubbing or cyanosis Muscle strength: grossly normal all extremities Range of motion: grossly normal all extremities Deformity: none  LYMPHATIC Cervical: none palpable Supraclavicular: none palpable  PSYCHIATRIC Oriented to person, place, and time: yes Mood and affect: normal for situation Judgment and insight: appropriate for situation   Assessment & Plan  NEOPLASM OF UNCERTAIN BEHAVIOR OF THYROID GLAND (D44.0)  Pt Education - Pamphlet Given - The Thyroid Book: discussed with patient and provided information. Patient presents with a newly diagnosed left thyroid mass suspicious for papillary thyroid carcinoma. She is accompanied by her sister. They are provided with written literature on thyroid surgery to review at home.  The patient and I discussed her history and previous testing. We reviewed her results. She has a dominant mass in the left thyroid lobe which is suspicious for malignancy. She also has small bilateral thyroid nodules. I have recommended proceeding with total thyroidectomy for definitive diagnosis and management. We did discuss the option for further diagnostic testing including molecular genetic testing.  We discussed the procedure of total thyroidectomy. We discussed the hospital stay to be anticipated. We discussed the risk and benefits including the risk of recurrent laryngeal nerve injury and injury to parathyroid glands. We discussed the need for lifelong thyroid hormone replacement following surgery. We discussed the potential need for radioactive iodine treatment. We will arrange for consultation with endocrinology after the final pathology results are available. Patient understands and wishes to proceed with surgery in the near future.  The risks and benefits of the procedure have been discussed at length with the patient. The patient understands the proposed  procedure, potential alternative treatments, and the course of recovery to be expected. All of the patient's questions have been answered at this time. The patient wishes to proceed with surgery.  Armandina Gemma, [facility] Office: [phone]

## 2017-06-19 ENCOUNTER — Other Ambulatory Visit: Payer: Self-pay

## 2017-06-19 ENCOUNTER — Encounter (HOSPITAL_COMMUNITY): Payer: Self-pay

## 2017-06-19 ENCOUNTER — Ambulatory Visit (HOSPITAL_COMMUNITY)
Admission: RE | Admit: 2017-06-19 | Discharge: 2017-06-19 | Disposition: A | Discharge status: Home / Self Care | Payer: 59 | Source: Ambulatory Visit | Attending: Anesthesiology | Admitting: Anesthesiology

## 2017-06-19 ENCOUNTER — Encounter (HOSPITAL_COMMUNITY)
Admission: RE | Admit: 2017-06-19 | Discharge: 2017-06-19 | Disposition: A | Discharge status: Home / Self Care | Payer: 59 | Source: Ambulatory Visit | Attending: Surgery | Admitting: Surgery

## 2017-06-19 DIAGNOSIS — Z01812 Encounter for preprocedural laboratory examination: Secondary | ICD-10-CM | POA: Insufficient documentation

## 2017-06-19 DIAGNOSIS — Z01818 Encounter for other preprocedural examination: Secondary | ICD-10-CM | POA: Insufficient documentation

## 2017-06-19 DIAGNOSIS — D440 Neoplasm of uncertain behavior of thyroid gland: Secondary | ICD-10-CM | POA: Diagnosis not present

## 2017-06-19 HISTORY — DX: Diaphragmatic hernia without obstruction or gangrene: K44.9

## 2017-06-19 HISTORY — DX: Anemia, unspecified: D64.9

## 2017-06-19 LAB — CBC
HEMATOCRIT: 33.6 % — AB (ref 36.0–46.0)
Hemoglobin: 10.5 g/dL — ABNORMAL LOW (ref 12.0–15.0)
MCH: 25.5 pg — AB (ref 26.0–34.0)
MCHC: 31.3 g/dL (ref 30.0–36.0)
MCV: 81.8 fL (ref 78.0–100.0)
PLATELETS: 230 10*3/uL (ref 150–400)
RBC: 4.11 MIL/uL (ref 3.87–5.11)
WBC: 6.7 10*3/uL (ref 4.0–10.5)

## 2017-06-19 LAB — HCG, SERUM, QUALITATIVE: Preg, Serum: NEGATIVE

## 2017-06-21 ENCOUNTER — Ambulatory Visit (HOSPITAL_COMMUNITY): Payer: 59 | Admitting: Anesthesiology

## 2017-06-21 ENCOUNTER — Encounter (HOSPITAL_COMMUNITY)
Admission: RE | Disposition: A | Discharge status: Home / Self Care | Payer: Self-pay | Source: Ambulatory Visit | Attending: Surgery

## 2017-06-21 ENCOUNTER — Observation Stay (HOSPITAL_COMMUNITY)
Admission: RE | Admit: 2017-06-21 | Discharge: 2017-06-22 | Disposition: A | Discharge status: Home / Self Care | Payer: 59 | Source: Ambulatory Visit | Attending: Surgery | Admitting: Surgery

## 2017-06-21 ENCOUNTER — Other Ambulatory Visit: Payer: Self-pay

## 2017-06-21 ENCOUNTER — Encounter (HOSPITAL_COMMUNITY): Payer: Self-pay | Admitting: *Deleted

## 2017-06-21 DIAGNOSIS — Z8349 Family history of other endocrine, nutritional and metabolic diseases: Secondary | ICD-10-CM | POA: Diagnosis not present

## 2017-06-21 DIAGNOSIS — G43909 Migraine, unspecified, not intractable, without status migrainosus: Secondary | ICD-10-CM | POA: Insufficient documentation

## 2017-06-21 DIAGNOSIS — K219 Gastro-esophageal reflux disease without esophagitis: Secondary | ICD-10-CM | POA: Insufficient documentation

## 2017-06-21 DIAGNOSIS — D440 Neoplasm of uncertain behavior of thyroid gland: Secondary | ICD-10-CM | POA: Diagnosis present

## 2017-06-21 DIAGNOSIS — D649 Anemia, unspecified: Secondary | ICD-10-CM | POA: Diagnosis not present

## 2017-06-21 DIAGNOSIS — Z79899 Other long term (current) drug therapy: Secondary | ICD-10-CM | POA: Insufficient documentation

## 2017-06-21 DIAGNOSIS — Z9884 Bariatric surgery status: Secondary | ICD-10-CM | POA: Insufficient documentation

## 2017-06-21 DIAGNOSIS — D489 Neoplasm of uncertain behavior, unspecified: Secondary | ICD-10-CM | POA: Diagnosis present

## 2017-06-21 DIAGNOSIS — C73 Malignant neoplasm of thyroid gland: Secondary | ICD-10-CM | POA: Diagnosis not present

## 2017-06-21 DIAGNOSIS — K449 Diaphragmatic hernia without obstruction or gangrene: Secondary | ICD-10-CM | POA: Insufficient documentation

## 2017-06-21 HISTORY — PX: THYROIDECTOMY: SHX17

## 2017-06-21 SURGERY — THYROIDECTOMY
Anesthesia: General | Site: Neck

## 2017-06-21 MED ORDER — LIDOCAINE 2% (20 MG/ML) 5 ML SYRINGE
INTRAMUSCULAR | Status: DC | PRN
Start: 2017-06-21 — End: 2017-06-21
  Administered 2017-06-21: 60 mg via INTRAVENOUS
  Administered 2017-06-21: 40 mg via INTRAVENOUS

## 2017-06-21 MED ORDER — DEXAMETHASONE SODIUM PHOSPHATE 10 MG/ML IJ SOLN
INTRAMUSCULAR | Status: DC | PRN
  Administered 2017-06-21: 10 mg via INTRAVENOUS

## 2017-06-21 MED ORDER — ACETAMINOPHEN 325 MG PO TABS: 650.0000 mg | ORAL_TABLET | Freq: Four times a day (QID) | ORAL | Status: DC | PRN

## 2017-06-21 MED ORDER — SUCCINYLCHOLINE CHLORIDE 200 MG/10ML IV SOSY
PREFILLED_SYRINGE | INTRAVENOUS | Status: DC | PRN
  Administered 2017-06-21: 140 mg via INTRAVENOUS

## 2017-06-21 MED ORDER — CEFAZOLIN SODIUM-DEXTROSE 2-4 GM/100ML-% IV SOLN
2.0000 g | INTRAVENOUS | Status: AC
  Administered 2017-06-21: 2 g via INTRAVENOUS
  Filled 2017-06-21: qty 100

## 2017-06-21 MED ORDER — FENTANYL CITRATE (PF) 250 MCG/5ML IJ SOLN
INTRAMUSCULAR | Status: AC
  Filled 2017-06-21: qty 5

## 2017-06-21 MED ORDER — EPHEDRINE 5 MG/ML INJ
INTRAVENOUS | Status: AC
  Filled 2017-06-21: qty 10

## 2017-06-21 MED ORDER — MIDAZOLAM HCL 2 MG/2ML IJ SOLN
INTRAMUSCULAR | Status: AC
  Filled 2017-06-21: qty 2

## 2017-06-21 MED ORDER — HYDROMORPHONE HCL 1 MG/ML IJ SOLN
1.0000 mg | INTRAMUSCULAR | Status: DC | PRN
  Administered 2017-06-21 – 2017-06-22 (×3): 1 mg via INTRAVENOUS
  Filled 2017-06-21 (×3): qty 1

## 2017-06-21 MED ORDER — CALCIUM CARBONATE 1250 (500 CA) MG PO TABS
2.0000 | ORAL_TABLET | Freq: Three times a day (TID) | ORAL | Status: DC
  Administered 2017-06-22 (×2): 1000 mg via ORAL
  Filled 2017-06-21 (×2): qty 1

## 2017-06-21 MED ORDER — SUCCINYLCHOLINE CHLORIDE 200 MG/10ML IV SOSY
PREFILLED_SYRINGE | INTRAVENOUS | Status: AC
  Filled 2017-06-21: qty 10

## 2017-06-21 MED ORDER — FENTANYL CITRATE (PF) 100 MCG/2ML IJ SOLN
25.0000 ug | INTRAMUSCULAR | Status: DC | PRN
  Administered 2017-06-21: 50 ug via INTRAVENOUS
  Administered 2017-06-21: 25 ug via INTRAVENOUS
  Administered 2017-06-21: 50 ug via INTRAVENOUS

## 2017-06-21 MED ORDER — ROCURONIUM BROMIDE 50 MG/5ML IV SOSY
PREFILLED_SYRINGE | INTRAVENOUS | Status: AC
  Filled 2017-06-21: qty 5

## 2017-06-21 MED ORDER — HYDROCODONE-ACETAMINOPHEN 5-325 MG PO TABS
1.0000 | ORAL_TABLET | ORAL | Status: DC | PRN
  Administered 2017-06-22: 1 via ORAL
  Administered 2017-06-22: 08:00:00 2 via ORAL
  Filled 2017-06-21: qty 1
  Filled 2017-06-21: qty 2

## 2017-06-21 MED ORDER — LIDOCAINE 2% (20 MG/ML) 5 ML SYRINGE
INTRAMUSCULAR | Status: AC
  Filled 2017-06-21: qty 5

## 2017-06-21 MED ORDER — SCOPOLAMINE 1 MG/3DAYS TD PT72
MEDICATED_PATCH | TRANSDERMAL | Status: AC
Start: 2017-06-21 — End: 2017-06-22
  Filled 2017-06-21: qty 1

## 2017-06-21 MED ORDER — ONDANSETRON 4 MG PO TBDP: 4.0000 mg | ORAL_TABLET | Freq: Four times a day (QID) | ORAL | Status: DC | PRN

## 2017-06-21 MED ORDER — PHENYLEPHRINE 40 MCG/ML (10ML) SYRINGE FOR IV PUSH (FOR BLOOD PRESSURE SUPPORT)
PREFILLED_SYRINGE | INTRAVENOUS | Status: AC
  Filled 2017-06-21: qty 10

## 2017-06-21 MED ORDER — TRAMADOL HCL 50 MG PO TABS: 50.0000 mg | ORAL_TABLET | Freq: Four times a day (QID) | ORAL | Status: DC | PRN

## 2017-06-21 MED ORDER — LACTATED RINGERS IV SOLN
INTRAVENOUS | Status: DC
Start: 2017-06-21 — End: 2017-06-21
  Administered 2017-06-21: 12:00:00 via INTRAVENOUS

## 2017-06-21 MED ORDER — FENTANYL CITRATE (PF) 100 MCG/2ML IJ SOLN
INTRAMUSCULAR | Status: DC | PRN
  Administered 2017-06-21 (×4): 50 ug via INTRAVENOUS

## 2017-06-21 MED ORDER — CHLORHEXIDINE GLUCONATE CLOTH 2 % EX PADS: 6.0000 | MEDICATED_PAD | Freq: Once | CUTANEOUS | Status: DC

## 2017-06-21 MED ORDER — PROMETHAZINE HCL 25 MG/ML IJ SOLN: 6.2500 mg | INTRAMUSCULAR | Status: DC | PRN

## 2017-06-21 MED ORDER — LIDOCAINE HCL 4 % MT SOLN
OROMUCOSAL | Status: DC | PRN
  Administered 2017-06-21: 2 mL via TOPICAL

## 2017-06-21 MED ORDER — FENTANYL CITRATE (PF) 100 MCG/2ML IJ SOLN
INTRAMUSCULAR | Status: AC
  Filled 2017-06-21: qty 4

## 2017-06-21 MED ORDER — PROPOFOL 10 MG/ML IV BOLUS
INTRAVENOUS | Status: AC
  Filled 2017-06-21: qty 20

## 2017-06-21 MED ORDER — ONDANSETRON HCL 4 MG/2ML IJ SOLN
INTRAMUSCULAR | Status: DC | PRN
  Administered 2017-06-21: 4 mg via INTRAVENOUS

## 2017-06-21 MED ORDER — DEXAMETHASONE SODIUM PHOSPHATE 10 MG/ML IJ SOLN
INTRAMUSCULAR | Status: AC
  Filled 2017-06-21: qty 1

## 2017-06-21 MED ORDER — ACETAMINOPHEN 650 MG RE SUPP: 650.0000 mg | Freq: Four times a day (QID) | RECTAL | Status: DC | PRN

## 2017-06-21 MED ORDER — ONDANSETRON HCL 4 MG/2ML IJ SOLN
4.0000 mg | Freq: Four times a day (QID) | INTRAMUSCULAR | Status: DC | PRN
  Administered 2017-06-21: 20:00:00 4 mg via INTRAVENOUS
  Filled 2017-06-21: qty 2

## 2017-06-21 MED ORDER — KCL IN DEXTROSE-NACL 20-5-0.45 MEQ/L-%-% IV SOLN
INTRAVENOUS | Status: DC
  Administered 2017-06-21: 18:00:00 via INTRAVENOUS
  Filled 2017-06-21 (×2): qty 1000

## 2017-06-21 MED ORDER — LIDOCAINE 2% (20 MG/ML) 5 ML SYRINGE
INTRAMUSCULAR | Status: AC
Start: 2017-06-21 — End: 2017-06-21
  Filled 2017-06-21: qty 5

## 2017-06-21 MED ORDER — PHENYLEPHRINE HCL 10 MG/ML IJ SOLN
INTRAMUSCULAR | Status: DC | PRN
  Administered 2017-06-21: 80 ug via INTRAVENOUS

## 2017-06-21 MED ORDER — ROCURONIUM BROMIDE 50 MG/5ML IV SOSY
PREFILLED_SYRINGE | INTRAVENOUS | Status: DC | PRN
  Administered 2017-06-21 (×2): 20 mg via INTRAVENOUS
  Administered 2017-06-21: 30 mg via INTRAVENOUS

## 2017-06-21 MED ORDER — SUGAMMADEX SODIUM 200 MG/2ML IV SOLN
INTRAVENOUS | Status: DC | PRN
  Administered 2017-06-21: 200 mg via INTRAVENOUS

## 2017-06-21 MED ORDER — ONDANSETRON HCL 4 MG/2ML IJ SOLN
INTRAMUSCULAR | Status: AC
  Filled 2017-06-21: qty 2

## 2017-06-21 MED ORDER — 0.9 % SODIUM CHLORIDE (POUR BTL) OPTIME
TOPICAL | Status: DC | PRN
  Administered 2017-06-21: 1000 mL

## 2017-06-21 MED ORDER — SCOPOLAMINE 1 MG/3DAYS TD PT72
1.0000 | MEDICATED_PATCH | Freq: Once | TRANSDERMAL | Status: DC
  Administered 2017-06-21: 1.5 mg via TRANSDERMAL

## 2017-06-21 MED ORDER — PROPOFOL 10 MG/ML IV BOLUS
INTRAVENOUS | Status: DC | PRN
  Administered 2017-06-21: 200 mg via INTRAVENOUS

## 2017-06-21 MED ORDER — MIDAZOLAM HCL 5 MG/5ML IJ SOLN
INTRAMUSCULAR | Status: DC | PRN
  Administered 2017-06-21: 2 mg via INTRAVENOUS

## 2017-06-21 SURGICAL SUPPLY — 36 items
ATTRACTOMAT 16X20 MAGNETIC DRP (DRAPES) ×3 IMPLANT
BLADE SURG 15 STRL LF DISP TIS (BLADE) ×1 IMPLANT
BLADE SURG 15 STRL SS (BLADE) ×2
CHLORAPREP W/TINT 26ML (MISCELLANEOUS) ×3 IMPLANT
CLIP VESOCCLUDE MED 6/CT (CLIP) ×9 IMPLANT
CLIP VESOCCLUDE SM WIDE 6/CT (CLIP) ×9 IMPLANT
CLOSURE WOUND 1/2 X4 (GAUZE/BANDAGES/DRESSINGS) ×1
COVER SURGICAL LIGHT HANDLE (MISCELLANEOUS) ×3 IMPLANT
DISSECTOR ROUND CHERRY 3/8 STR (MISCELLANEOUS) IMPLANT
DRAPE LAPAROTOMY T 98X78 PEDS (DRAPES) ×3 IMPLANT
ELECT PENCIL ROCKER SW 15FT (MISCELLANEOUS) ×3 IMPLANT
ELECT REM PT RETURN 15FT ADLT (MISCELLANEOUS) ×3 IMPLANT
GAUZE SPONGE 4X4 12PLY STRL (GAUZE/BANDAGES/DRESSINGS) ×3 IMPLANT
GAUZE SPONGE 4X4 16PLY XRAY LF (GAUZE/BANDAGES/DRESSINGS) ×3 IMPLANT
GLOVE SURG ORTHO 8.0 STRL STRW (GLOVE) ×3 IMPLANT
GOWN STRL REUS W/TWL XL LVL3 (GOWN DISPOSABLE) ×9 IMPLANT
HEMOSTAT SURGICEL 2X4 FIBR (HEMOSTASIS) IMPLANT
ILLUMINATOR WAVEGUIDE N/F (MISCELLANEOUS) ×3 IMPLANT
KIT BASIN OR (CUSTOM PROCEDURE TRAY) ×3 IMPLANT
LIGHT WAVEGUIDE WIDE FLAT (MISCELLANEOUS) IMPLANT
PACK BASIC VI WITH GOWN DISP (CUSTOM PROCEDURE TRAY) ×3 IMPLANT
POWDER SURGICEL 3.0 GRAM (HEMOSTASIS) ×3 IMPLANT
SHEARS HARMONIC 9CM CVD (BLADE) ×3 IMPLANT
STAPLER VISISTAT 35W (STAPLE) IMPLANT
STRIP CLOSURE SKIN 1/2X4 (GAUZE/BANDAGES/DRESSINGS) ×2 IMPLANT
SUT MNCRL AB 4-0 PS2 18 (SUTURE) ×3 IMPLANT
SUT SILK 2 0 (SUTURE) ×2
SUT SILK 2-0 18XBRD TIE 12 (SUTURE) ×1 IMPLANT
SUT SILK 3 0 (SUTURE) ×2
SUT SILK 3-0 18XBRD TIE 12 (SUTURE) ×1 IMPLANT
SUT VIC AB 3-0 SH 18 (SUTURE) ×6 IMPLANT
SYR BULB IRRIGATION 50ML (SYRINGE) ×3 IMPLANT
TAPE CLOTH SURG 4X10 WHT LF (GAUZE/BANDAGES/DRESSINGS) ×3 IMPLANT
TOWEL OR 17X26 10 PK STRL BLUE (TOWEL DISPOSABLE) ×3 IMPLANT
TOWEL OR NON WOVEN STRL DISP B (DISPOSABLE) ×3 IMPLANT
YANKAUER SUCT BULB TIP 10FT TU (MISCELLANEOUS) ×3 IMPLANT

## 2017-06-21 NOTE — Op Note (Signed)
Procedure Note  Pre-operative Diagnosis:  Thyroid neoplasm of uncertain behavior, suspicious for papillary thyroid carcinoma  Post-operative Diagnosis:  same  Surgeon:  Armandina Gemma, MD  Assistant:  none   Procedure:  Total thyroidectomy with limited lymph node dissection  Anesthesia:  General  Estimated Blood Loss:  minimal  Drains: none         Specimen: thyroid to pathology  Indications:  Patient is referred by her primary care physician, Dr. Kennith Maes, for evaluation of left thyroid nodule with suspected papillary thyroid carcinoma. Patient first developed a globus sensation and mild dysphagia approximately 10 months ago. She eventually underwent evaluation by ENT including laryngoscopy. Subsequently she underwent a thyroid ultrasound which was performed on February 28, 2017. This showed a normal size thyroid gland. There was a dominant nodule in the left lobe measuring 3.4 cm. Biopsy was recommended. There were smaller nodules in the left lobe and in the right lobe. The largest nodule on the right measured 8 mm. Biopsy was performed and showed a follicular neoplasm suspicious for papillary thyroid carcinoma. Additional testing was not performed. Patient underwent a PET scan of the chest abdomen and pelvis on May 08, 2017. This was to evaluate pulmonary nodules. The thyroid nodule was hypermetabolic.   Procedure Details: Procedure was done in OR #1 at the [facility].  The patient was brought to the operating room and placed in a supine position on the operating room table.  Following administration of general anesthesia, the patient was positioned and then prepped and draped in the usual aseptic fashion.  After ascertaining that an adequate level of anesthesia had been achieved, a Kocher incision was made with #15 blade.  Dissection was carried through subcutaneous tissues and platysma. Hemostasis was achieved with the electrocautery.  Skin flaps were elevated  cephalad and caudad from the thyroid notch to the sternal notch.  The Mahorner self-retaining retractor was placed for exposure.  Strap muscles were incised in the midline and dissection was begun on the left side.  Strap muscles were reflected laterally.  Left thyroid lobe was mildly enlarged with a dominant soft nodule.  The left lobe was gently mobilized with blunt dissection.  Superior pole vessels were dissected out and divided individually between small and medium Ligaclips with the Harmonic scalpel.  The thyroid lobe was rolled anteriorly.  Branches of the inferior thyroid artery were divided between small Ligaclips with the Harmonic scalpel.  Inferior venous tributaries were divided between Ligaclips.  Both the superior and inferior parathyroid glands were identified and preserved on their vascular pedicles.  The recurrent laryngeal nerve was identified and preserved along its course.  The ligament of Gwenlyn Found was released with the electrocautery and the gland was mobilized onto the anterior trachea. Isthmus was mobilized across the midline.  There was a moderate pyramidal lobe present which was dissected off of the thyroid cartilage and resected with the isthmus en bloc.  Dry pack was placed in the left neck.  Next, the right thyroid lobe was gently mobilized with blunt dissection.  Right thyroid lobe was normal in size with small dense nodule in the inferior pole.  Superior pole vessels were dissected out and divided between small and medium Ligaclips with the Harmonic scalpel.  Superior parathyroid was identified and preserved.  Inferior venous tributaries were divided between medium Ligaclips with the Harmonic scalpel.  The right thyroid lobe was rolled anteriorly and the branches of the inferior thyroid artery divided between small Ligaclips.  The right recurrent laryngeal  nerve was identified and preserved along its course.  The ligament of Gwenlyn Found was released with the electrocautery.  The right thyroid  lobe was mobilized onto the anterior trachea and the remainder of the thyroid was dissected off the anterior trachea and the thyroid was completely excised.  A suture was used to mark the left lobe. The entire thyroid gland was submitted to pathology for review.  Central compartment lymph nodes were sampled by excising tissue anterior to the trachea between the carotid arteries and inferiorly to the subclavian artery.  A small amount of thymus was resected with the specimen.  Hemostasis was obtained with ligaclips and the electrocautery.  The neck was irrigated with warm saline.  Surgicel powder was placed throughout the operative field.  Strap muscles were reapproximated in the midline with interrupted 3-0 Vicryl sutures.  Platysma was closed with interrupted 3-0 Vicryl sutures.  Skin was closed with a running 4-0 Monocryl subcuticular suture.  Wound was washed and dried and steri-strips were applied.  Dry gauze dressing was placed.  The patient was awakened from anesthesia and brought to the recovery room.  The patient tolerated the procedure well.   Armandina Gemma, MD [facility] Office: [phone]

## 2017-06-21 NOTE — Anesthesia Postprocedure Evaluation (Signed)
Anesthesia Post Note  Patient: Suzanne Green  Procedure(s) Performed: TOTAL THYROIDECTOMY WITH LIMITED LYMPH NODE DISSECTION (N/A Neck)     Patient location during evaluation: PACU Anesthesia Type: General Level of consciousness: awake and alert Pain management: pain level controlled Vital Signs Assessment: post-procedure vital signs reviewed and stable Respiratory status: spontaneous breathing, nonlabored ventilation and respiratory function stable Cardiovascular status: blood pressure returned to baseline and stable Postop Assessment: no apparent nausea or vomiting Anesthetic complications: no    Last Vitals:  Vitals:   06/21/17 1630 06/21/17 1645  BP: 127/85 133/76  Pulse: (!) 58 (!) 55  Resp: 13 14  Temp:  36.4 C  SpO2: 100% 100%    Last Pain:  Vitals:   06/21/17 1645  TempSrc:   PainSc: Victor E Brock

## 2017-06-21 NOTE — Anesthesia Preprocedure Evaluation (Addendum)
Anesthesia Evaluation  Patient identified by MRN, date of birth, ID band Patient awake    Reviewed: Allergy & Precautions, H&P , NPO status , Patient's Chart, lab work & pertinent test results  Airway Mallampati: II  TM Distance: <3 FB Neck ROM: Full    Dental  (+) Dental Advisory Given, Teeth Intact   Pulmonary neg pulmonary ROS, neg sleep apnea, neg COPD,    Pulmonary exam normal breath sounds clear to auscultation       Cardiovascular (-) Past MI negative cardio ROS Normal cardiovascular exam Rhythm:Regular Rate:Normal     Neuro/Psych  Headaches, negative psych ROS   GI/Hepatic Neg liver ROS, hiatal hernia, GERD  Medicated and Controlled,  Endo/Other  neg diabetesObesity  Renal/GU negative Renal ROS  negative genitourinary   Musculoskeletal negative musculoskeletal ROS (+)   Abdominal   Peds negative pediatric ROS (+)  Hematology  (+) anemia ,   Anesthesia Other Findings   Reproductive/Obstetrics negative OB ROS                            Anesthesia Physical  Anesthesia Plan  ASA: II  Anesthesia Plan: General   Post-op Pain Management:    Induction: Intravenous  PONV Risk Score and Plan: 3 and Treatment may vary due to age or medical condition, Ondansetron, Dexamethasone and Midazolam  Airway Management Planned: Oral ETT  Additional Equipment: None  Intra-op Plan:   Post-operative Plan: Extubation in OR  Informed Consent: I have reviewed the patients History and Physical, chart, labs and discussed the procedure including the risks, benefits and alternatives for the proposed anesthesia with the patient or authorized representative who has indicated his/her understanding and acceptance.   Dental advisory given  Plan Discussed with: CRNA  Anesthesia Plan Comments:        Anesthesia Quick Evaluation

## 2017-06-21 NOTE — Transfer of Care (Signed)
Immediate Anesthesia Transfer of Care Note  Patient: Suzanne Green  Procedure(s) Performed: TOTAL THYROIDECTOMY WITH LIMITED LYMPH NODE DISSECTION (N/A Neck)  Patient Location: PACU  Anesthesia Type:General  Level of Consciousness: awake, alert  and oriented  Airway & Oxygen Therapy: Patient Spontanous Breathing and Patient connected to face mask oxygen  Post-op Assessment: Report given to RN and Post -op Vital signs reviewed and stable  Post vital signs: Reviewed and stable  Last Vitals:  Vitals:   06/21/17 1146  BP: 131/80  Pulse: (!) 51  Resp: 16  Temp: 36.9 C  SpO2: 100%    Last Pain:  Vitals:   06/21/17 1203  TempSrc:   PainSc: 2       Patients Stated Pain Goal: 3 (78/41/28 2081)  Complications: No apparent anesthesia complications

## 2017-06-21 NOTE — Anesthesia Procedure Notes (Signed)
Procedure Name: Intubation Performed by: Maxwell Caul, CRNA Pre-anesthesia Checklist: Patient identified, Emergency Drugs available, Suction available and Patient being monitored Patient Re-evaluated:Patient Re-evaluated prior to induction Oxygen Delivery Method: Circle system utilized Preoxygenation: Pre-oxygenation with 100% oxygen Induction Type: IV induction Ventilation: Mask ventilation without difficulty Laryngoscope Size: Miller and 2 Grade View: Grade I Tube type: Oral Number of attempts: 1 Airway Equipment and Method: Patient positioned with wedge pillow and Stylet Placement Confirmation: ETT inserted through vocal cords under direct vision,  positive ETCO2 and breath sounds checked- equal and bilateral Secured at: 22 cm Tube secured with: Tape Dental Injury: Teeth and Oropharynx as per pre-operative assessment

## 2017-06-21 NOTE — Interval H&P Note (Signed)
History and Physical Interval Note:  06/21/2017 1:25 PM  Suzanne Green  has presented today for surgery, with the diagnosis of THYROID NEOPLASM OF UNCERTAIN BEHAVIOR.   The various methods of treatment have been discussed with the patient and family. After consideration of risks, benefits and other options for treatment, the patient has consented to    Procedure(s): TOTAL THYROIDECTOMY WITH LIMITED LYMPH NODE DISSECTION (N/A) as a surgical intervention .    The patient's history has been reviewed, patient examined, no change in status, stable for surgery.  I have reviewed the patient's chart and labs.  Questions were answered to the patient's satisfaction.    Armandina Gemma, [facility] [facility] Office: [place]

## 2017-06-22 ENCOUNTER — Encounter (HOSPITAL_COMMUNITY): Payer: Self-pay | Admitting: Surgery

## 2017-06-22 DIAGNOSIS — Z79899 Other long term (current) drug therapy: Secondary | ICD-10-CM | POA: Diagnosis not present

## 2017-06-22 DIAGNOSIS — K449 Diaphragmatic hernia without obstruction or gangrene: Secondary | ICD-10-CM | POA: Diagnosis not present

## 2017-06-22 DIAGNOSIS — K219 Gastro-esophageal reflux disease without esophagitis: Secondary | ICD-10-CM | POA: Diagnosis not present

## 2017-06-22 DIAGNOSIS — D649 Anemia, unspecified: Secondary | ICD-10-CM | POA: Diagnosis not present

## 2017-06-22 DIAGNOSIS — C73 Malignant neoplasm of thyroid gland: Secondary | ICD-10-CM | POA: Diagnosis not present

## 2017-06-22 DIAGNOSIS — Z8349 Family history of other endocrine, nutritional and metabolic diseases: Secondary | ICD-10-CM | POA: Diagnosis not present

## 2017-06-22 DIAGNOSIS — G43909 Migraine, unspecified, not intractable, without status migrainosus: Secondary | ICD-10-CM | POA: Diagnosis not present

## 2017-06-22 DIAGNOSIS — Z9884 Bariatric surgery status: Secondary | ICD-10-CM | POA: Diagnosis not present

## 2017-06-22 LAB — BASIC METABOLIC PANEL
Anion gap: 7 (ref 5–15)
BUN: 10 mg/dL (ref 6–20)
CALCIUM: 9.2 mg/dL (ref 8.9–10.3)
CO2: 26 mmol/L (ref 22–32)
Chloride: 107 mmol/L (ref 101–111)
Creatinine, Ser: 0.83 mg/dL (ref 0.44–1.00)
GFR calc non Af Amer: >60 mL/min (ref >60)
Glucose, Bld: 98 mg/dL (ref 65–99)
Potassium: 4.4 mmol/L (ref 3.5–5.1)
SODIUM: 140 mmol/L (ref 135–145)

## 2017-06-22 MED ORDER — OXYCODONE HCL 5 MG PO TABS: 5.0000 mg | ORAL_TABLET | ORAL | 0 refills | Status: DC | PRN

## 2017-06-22 MED ORDER — OXYCODONE HCL 5 MG PO TABS
10.0000 mg | ORAL_TABLET | ORAL | Status: DC | PRN
  Administered 2017-06-22: 10 mg via ORAL
  Filled 2017-06-22: qty 2

## 2017-06-22 MED ORDER — SYNTHROID 100 MCG PO TABS: 100.0000 ug | ORAL_TABLET | Freq: Every day | ORAL | 3 refills | Status: DC

## 2017-06-22 MED ORDER — CALCIUM CARBONATE ANTACID 500 MG PO CHEW: 2.0000 | CHEWABLE_TABLET | Freq: Two times a day (BID) | ORAL | 1 refills | Status: DC

## 2017-06-22 MED FILL — SYNTHROID 100 MCG TABLET: 100 | 30 days supply | Qty: 30 | Fill #0

## 2017-06-22 MED FILL — oxyCODONE HCL 5 MG TABS: 5 | 2 days supply | Qty: 20 | Fill #0

## 2017-06-22 NOTE — Discharge Summary (Signed)
Physician Discharge Summary [facility]  Patient ID: Suzanne Green MRN: [id number] DOB/AGE: [birth year] 41 y.o.  Admit date: 06/21/2017 Discharge date: 06/22/2017  Admission Diagnoses:  Thyroid neoplasm of uncertain behavior, suspect papillary thyroid carcinoma  Discharge Diagnoses:  Principal Problem:   Neoplasm of uncertain behavior of thyroid gland   Discharged Condition: good  Hospital Course: Patient was admitted for observation following thyroid surgery.  Post op course was uncomplicated.  Pain was well controlled.  Tolerated diet.  Post op calcium level on morning following surgery was 9.2 mg/dl.  Patient was prepared for discharge home on POD#1.  Consults: None  Treatments: surgery: total thyroidectomy with limited lymph node dissection  Discharge Exam: Blood pressure (!) 111/55, pulse (!) 53, temperature 98 F (36.7 C), temperature source Oral, resp. rate 15, height 5\' 4"  (1.626 m), weight 91.2 kg (201 lb 2 oz), last menstrual period 06/17/2017, SpO2 99 %. HEENT - clear Neck - wound dry and intact; mild STS; voice normal Chest - clear bilaterally Cor - RRR  Disposition: Home  Discharge Instructions    Diet - low sodium heart healthy   Complete by:  As directed    Discharge instructions   Complete by:  As directed    [facility]  THYROID & PARATHYROID SURGERY:  POST-OP INSTRUCTIONS  Always review your discharge instruction sheet from the facility where your surgery was performed.  A prescription for pain medication may be given to you upon discharge.  Take your pain medication as prescribed.  If narcotic pain medicine is not needed, then you may take acetaminophen (Tylenol) or ibuprofen (Advil) as needed.  Take your usually prescribed medications unless otherwise directed.  If you need a refill on your pain medication, please contact our office during regular business hours.  Prescriptions will not be processed by our  office after 5 pm or on weekends.  Start with a light diet upon arrival home, such as soup and crackers or toast.  Be sure to drink plenty of fluids daily.  Resume your normal diet the day after surgery.  Most patients will experience some swelling and bruising on the chest and neck area.  Ice packs will help.  Swelling and bruising can take several days to resolve.   It is common to experience some constipation after surgery.  Increasing fluid intake and taking a stool softener (Colace) will usually help or prevent this problem.  A mild laxative (Milk of Magnesia or Miralax) should be taken according to package directions if there has been no bowel movement after 48 hours.  You have steri-strips and a gauze dressing over your incision.  You may remove the gauze bandage on the second day after surgery, and you may shower at that time.  Leave your steri-strips (small skin tapes) in place directly over the incision.  These strips should remain on the skin for 5-7 days and then be removed.  You may get them wet in the shower and pat them dry.  You may resume regular (light) daily activities beginning the next day - such as daily self-care, walking, climbing stairs - gradually increasing activities as tolerated.  You may have sexual intercourse when it is comfortable.  Refrain from any heavy lifting or straining until approved by your doctor.  You may drive when you no longer are taking prescription pain medication, you can comfortably wear a seatbelt, and you can safely maneuver your car and apply brakes.  You should see your doctor in  the office for a follow-up appointment approximately three weeks after your surgery.  Make sure that you call for this appointment within a day or two after you arrive home to insure a convenient appointment time.  WHEN TO CALL YOUR DOCTOR: -- Fever greater than 101.5 -- Inability to urinate -- Nausea and/or vomiting - persistent -- Extreme swelling or bruising --  Continued bleeding from incision -- Increased pain, redness, or drainage from the incision -- Difficulty swallowing or breathing -- Muscle cramping or spasms -- Numbness or tingling in hands or around lips  The clinic staff is available to answer your questions during regular business hours.  Please don't hesitate to call and ask to speak to one of the nurses if you have concerns.  Earnstine Regal, MD, [facility] Office: [phone]  Website: [email or web address]   Increase activity slowly   Complete by:  As directed    Remove dressing in 24 hours   Complete by:  As directed      Allergies as of 06/22/2017      Reactions   Bee Venom Anaphylaxis      Medication List    TAKE these medications   calcium carbonate 500 MG chewable tablet Commonly known as:  TUMS Chew 2 tablets (400 mg of elemental calcium total) 2 (two) times daily by mouth.   EPINEPHrine 0.3 mg/0.3 mL Devi Commonly known as:  EPI-PEN Inject 0.3 mg into the muscle once.   ferrous sulfate 325 (65 FE) MG tablet Take 325 mg by mouth daily with breakfast.   ibuprofen 600 MG tablet Commonly known as:  ADVIL,MOTRIN Take 1 tablet (600 mg total) by mouth every 6 (six) hours as needed. What changed:  reasons to take this   multivitamin with minerals Tabs tablet Take 1 tablet by mouth daily.   ondansetron 4 MG disintegrating tablet Commonly known as:  ZOFRAN ODT Take 1 tablet (4 mg total) by mouth every 8 (eight) hours as needed for nausea or vomiting.   oxyCODONE 5 MG immediate release tablet Commonly known as:  Oxy IR/ROXICODONE Take 1-2 tablets (5-10 mg total) every 4 (four) hours as needed by mouth for moderate pain.   oxyCODONE-acetaminophen 5-325 MG tablet Commonly known as:  PERCOCET/ROXICET Take 1-2 tablets by mouth every 6 (six) hours as needed for severe pain.   ranitidine 150 MG tablet Commonly known as:  ZANTAC Take 150 mg by  mouth 2 (two) times daily as needed for heartburn.   rizatriptan 10 MG tablet Commonly known as:  MAXALT Take 10 mg by mouth as needed for migraine. May repeat in 2 hours if needed   SYNTHROID 100 MCG tablet Generic drug:  levothyroxine Take 1 tablet (100 mcg total) daily by mouth.   tamsulosin 0.4 MG Caps capsule Commonly known as:  FLOMAX Take 1 capsule (0.4 mg total) by mouth daily.      Follow-up Information    Armandina Gemma, MD. Schedule an appointment as soon as possible for a visit in 3 week(s).   Specialty:  General Surgery Contact information: [address] [place] [address] [phone]           Earnstine Regal, MD, [facility] Office: [phone]   Signed: Earnstine Regal 06/22/2017, 3:31 PM

## 2017-07-09 DIAGNOSIS — D440 Neoplasm of uncertain behavior of thyroid gland: Secondary | ICD-10-CM | POA: Diagnosis not present

## 2017-07-20 ENCOUNTER — Encounter (HOSPITAL_COMMUNITY): Payer: Self-pay

## 2017-07-20 MED FILL — SYNTHROID 100 MCG TABLET: 100 | 30 days supply | Qty: 30 | Fill #1

## 2017-07-25 DIAGNOSIS — R935 Abnormal findings on diagnostic imaging of other abdominal regions, including retroperitoneum: Secondary | ICD-10-CM | POA: Diagnosis not present

## 2017-07-25 DIAGNOSIS — N920 Excessive and frequent menstruation with regular cycle: Secondary | ICD-10-CM | POA: Diagnosis not present

## 2017-07-25 DIAGNOSIS — Z9884 Bariatric surgery status: Secondary | ICD-10-CM | POA: Diagnosis not present

## 2017-07-25 DIAGNOSIS — Z862 Personal history of diseases of the blood and blood-forming organs and certain disorders involving the immune mechanism: Secondary | ICD-10-CM | POA: Diagnosis not present

## 2017-07-25 DIAGNOSIS — C73 Malignant neoplasm of thyroid gland: Secondary | ICD-10-CM | POA: Diagnosis not present

## 2017-07-25 DIAGNOSIS — D500 Iron deficiency anemia secondary to blood loss (chronic): Secondary | ICD-10-CM | POA: Diagnosis not present

## 2017-08-14 ENCOUNTER — Ambulatory Visit (INDEPENDENT_AMBULATORY_CARE_PROVIDER_SITE_OTHER): Payer: No Typology Code available for payment source | Admitting: Endocrinology

## 2017-08-14 ENCOUNTER — Encounter: Payer: Self-pay | Admitting: Endocrinology

## 2017-08-14 DIAGNOSIS — E890 Postprocedural hypothyroidism: Secondary | ICD-10-CM | POA: Diagnosis not present

## 2017-08-14 DIAGNOSIS — C73 Malignant neoplasm of thyroid gland: Secondary | ICD-10-CM

## 2017-08-14 DIAGNOSIS — E039 Hypothyroidism, unspecified: Secondary | ICD-10-CM | POA: Insufficient documentation

## 2017-08-14 LAB — TSH: TSH: 16.17 u[IU]/mL — AB (ref 0.35–4.50)

## 2017-08-14 NOTE — Patient Instructions (Signed)
blood tests are requested for you today.  We'll let you know about the results.  Please stop taking the thyroid pill.  Our plan will be to wait until the blood test is at least 30, then order low-dose radioactive iodine.   Please come back for a follow-up appointment in 2-3 months.

## 2017-08-14 NOTE — Progress Notes (Signed)
Subjective:    Patient ID: Suzanne Green, female    DOB: 12-24-1975, 42 y.o.   MRN: [id number]  HPI Pt is referred by Dr Harlow Asa, for thyroid cancer.  She had thyroidect 11/18, after a nodule was found to have suspicious cytol result.  She takes synthroid 100 mcg/d.  Site is healing well.  She has slight itching at the ant neck, but no assoc pain.   Past Medical History:  Diagnosis Date  . Anemia 2017  . GERD (gastroesophageal reflux disease)   . Hernia, hiatal 2017  . Migraines    since 1998  . Plantar fasciitis     Past Surgical History:  Procedure Laterality Date  . abdominal cyst removed  04/2002  . BREAST IMPLANT EXCHANGE  2016  . BREATH TEK H PYLORI  09/18/2011   Procedure: BREATH TEK H PYLORI;  Surgeon: Shann Medal, MD;  Location: [facility];  Service: General;  Laterality: N/A;  . CESAREAN SECTION  12/26/07, 10/31/04  . GASTRIC ROUX-EN-Y  12/19/2011   Procedure: LAPAROSCOPIC ROUX-EN-Y GASTRIC;  Surgeon: Shann Medal, MD;  Location: [facility];  Service: General;  Laterality: N/A;  . THYROIDECTOMY N/A 06/21/2017   Procedure: TOTAL THYROIDECTOMY WITH LIMITED LYMPH NODE DISSECTION;  Surgeon: Armandina Gemma, MD;  Location: [facility];  Service: General;  Laterality: N/A;  . TUBAL LIGATION  12/2007    Social History   Socioeconomic History  . Marital status: Legally Separated    Spouse name: Not on file  . Number of children: Not on file  . Years of education: Not on file  . Highest education level: Not on file  Social Needs  . Financial resource strain: Not on file  . Food insecurity - worry: Not on file  . Food insecurity - inability: Not on file  . Transportation needs - medical: Not on file  . Transportation needs - non-medical: Not on file  Occupational History  . Not on file  Tobacco Use  . Smoking status: Never Smoker  . Smokeless tobacco: Never Used  Substance and Sexual Activity  . Alcohol use: No  . Drug use: No  . Sexual activity: No  Other Topics Concern   . Not on file  Social History Narrative  . Not on file    Current Outpatient Medications on File Prior to Visit  Medication Sig Dispense Refill  . EPINEPHrine (EPI-PEN) 0.3 mg/0.3 mL DEVI Inject 0.3 mg into the muscle once.     . ferrous sulfate 325 (65 FE) MG tablet Take 325 mg by mouth daily with breakfast.    . ibuprofen (ADVIL,MOTRIN) 600 MG tablet Take 1 tablet (600 mg total) by mouth every 6 (six) hours as needed. (Patient taking differently: Take 600 mg by mouth every 6 (six) hours as needed for headache or moderate pain. ) 30 tablet 0  . Multiple Vitamin (MULTIVITAMIN WITH MINERALS) TABS tablet Take 1 tablet by mouth daily.    . ondansetron (ZOFRAN ODT) 4 MG disintegrating tablet Take 1 tablet (4 mg total) by mouth every 8 (eight) hours as needed for nausea or vomiting. 10 tablet 0  . ranitidine (ZANTAC) 150 MG tablet Take 150 mg by mouth 2 (two) times daily as needed for heartburn.    . rizatriptan (MAXALT) 10 MG tablet Take 10 mg by mouth as needed for migraine. May repeat in 2 hours if needed    . tamsulosin (FLOMAX) 0.4 MG CAPS capsule Take 1 capsule (0.4 mg total) by mouth daily. 7  capsule 0  . oxyCODONE-acetaminophen (PERCOCET/ROXICET) 5-325 MG tablet Take 1-2 tablets by mouth every 6 (six) hours as needed for severe pain. (Patient not taking: Reported on 08/14/2017) 20 tablet 0   No current facility-administered medications on file prior to visit.     Allergies  Allergen Reactions  . Bee Venom Anaphylaxis    No family history on file.  BP 118/84 (BP Location: Left Arm, Patient Position: Sitting, Cuff Size: Normal)   Pulse 60   Wt 202 lb (91.6 kg)   SpO2 97%   BMI 34.67 kg/m    Review of Systems denies sob, memory loss, constipation, numbness, blurry vision, myalgias, and syncope.  She has easy bruising, dry skin, weight gain, hair loss, mild depression, cold intolerance, and rhinorrhea.    Objective:   Physical Exam VS: see vs page GEN: no distress HEAD: head:  no deformity eyes: no periorbital swelling, no proptosis external nose and ears are normal mouth: no lesion seen NECK: Neck: a healed scar is present.  I do not appreciate a nodule in the thyroid or elsewhere in the neck CHEST WALL: no deformity LUNGS: clear to auscultation CV: reg rate and rhythm, no murmur ABD: abdomen is soft, nontender.  no hepatosplenomegaly.  not distended.  no hernia MUSCULOSKELETAL: muscle bulk and strength are grossly normal.  no obvious joint swelling.  gait is normal and steady EXTEMITIES: no deformity.  no edema PULSES: no carotid bruit NEURO:  cn 2-12 grossly intact.   readily moves all 4's.  sensation is intact to touch on all 4's SKIN:  Normal texture and temperature.  No rash or suspicious lesion is visible.   NODES:  None palpable at the neck PSYCH: alert, well-oriented.  Does not appear anxious nor depressed.  Lab Results  Component Value Date   CALCIUM 9.2 06/22/2017    Pathology: MULTIFOCAL FOLLICULAR VARIANT OF PAPILLARY CARCINOMA, LARGEST FOCUS 2.5 CM, LIMITED TO THYROID GLAND.  I have reviewed outside records, and summarized: Pt was noted to have thyroid cancer, and referred here.  Ca++ level remained normal after surgery.       Assessment & Plan:  Stage 1 differentiated thyroid cancer, new to me.  We discussed options.  She chooses low-dose adjuvant RAI.    Itching.  We discussed.  This is associated with the healing process.     Patient Instructions  blood tests are requested for you today.  We'll let you know about the results.  Please stop taking the thyroid pill.  Our plan will be to wait until the blood test is at least 30, then order low-dose radioactive iodine.   Please come back for a follow-up appointment in 2-3 months.

## 2017-08-22 ENCOUNTER — Other Ambulatory Visit (INDEPENDENT_AMBULATORY_CARE_PROVIDER_SITE_OTHER): Payer: No Typology Code available for payment source

## 2017-08-22 ENCOUNTER — Other Ambulatory Visit: Payer: Self-pay | Admitting: Endocrinology

## 2017-08-22 DIAGNOSIS — E890 Postprocedural hypothyroidism: Secondary | ICD-10-CM

## 2017-08-22 DIAGNOSIS — D440 Neoplasm of uncertain behavior of thyroid gland: Secondary | ICD-10-CM

## 2017-08-22 LAB — TSH: TSH: 44.42 u[IU]/mL — ABNORMAL HIGH (ref 0.35–4.50)

## 2017-08-30 ENCOUNTER — Telehealth: Payer: Self-pay | Admitting: Endocrinology

## 2017-08-30 NOTE — Telephone Encounter (Signed)
Patient is waiting for referral for radioactive iodine. Please call patient at ph# [phone] to advise

## 2017-08-31 NOTE — Telephone Encounter (Signed)
Please refer request to [facility]

## 2017-09-05 NOTE — Telephone Encounter (Signed)
Please advise on below  

## 2017-09-05 NOTE — Telephone Encounter (Signed)
Pt is wanting to speak to someone about iodine. She states she has been waiting for a phone call but hasnt heard anything    Please advise

## 2017-09-06 ENCOUNTER — Telehealth: Payer: Self-pay | Admitting: Endocrinology

## 2017-09-06 NOTE — Telephone Encounter (Signed)
Colletta Maryland has sent a message to Nonah Mattes as a high priority. What should I call & advise patient?

## 2017-09-06 NOTE — Telephone Encounter (Signed)
Please tell her we are checking on it.

## 2017-09-06 NOTE — Telephone Encounter (Signed)
I called and told patient that we were checking into the issue. Messages have been forwarded to [facility] & we are waiting on that status.

## 2017-09-06 NOTE — Telephone Encounter (Signed)
Patient has been off Synthroid for over 3 weeks-feels awful-She needs radioactive iodine pills - TSH was over 40 on 08/22/17. Please call patient [phone] and Suzanne Green has been calling daily-Please call Oncologist or whoever administers that RAI that Dr. Loanne Drilling was going to talk to because she needs to be able to take her Synthroid

## 2017-09-06 NOTE — Telephone Encounter (Signed)
Suzanne Green, This patient has called multiple times this week asking for a status of the whole body scan and RAI referral. Please try to get to this soon. Thank you

## 2017-09-20 MED FILL — RIZATRIPTAN BENZOATE 10 MG: 10 | 30 days supply | Qty: 12 | Fill #1

## 2017-09-20 MED FILL — SYNTHROID 100 MCG TABLET: 100 | 30 days supply | Qty: 30 | Fill #2

## 2017-09-21 ENCOUNTER — Telehealth: Payer: Self-pay | Admitting: Endocrinology

## 2017-09-21 ENCOUNTER — Encounter (HOSPITAL_COMMUNITY)
Admission: RE | Admit: 2017-09-21 | Discharge: 2017-09-21 | Disposition: A | Discharge status: Home / Self Care | Payer: No Typology Code available for payment source | Source: Ambulatory Visit | Attending: Endocrinology | Admitting: Endocrinology

## 2017-09-21 DIAGNOSIS — D440 Neoplasm of uncertain behavior of thyroid gland: Secondary | ICD-10-CM | POA: Insufficient documentation

## 2017-09-21 LAB — HCG, SERUM, QUALITATIVE: Preg, Serum: NEGATIVE

## 2017-09-21 MED ORDER — SODIUM IODIDE I 131 CAPSULE
30.0000 | Freq: Once | INTRAVENOUS | Status: AC | PRN
  Administered 2017-09-21: 30 via ORAL

## 2017-09-21 NOTE — Telephone Encounter (Signed)
Please resume the synthroid when you go back for the post-therapy scan

## 2017-09-21 NOTE — Telephone Encounter (Signed)
Patient notified

## 2017-09-21 NOTE — Telephone Encounter (Signed)
Pt is Having RAI done today. She stated dr wanted her to stop taking her Synthroid medication for this. Pt would like to know how long after this is done should she waite before she starts back on the Synthroid   Please advise

## 2017-09-25 ENCOUNTER — Encounter: Payer: Self-pay | Admitting: Endocrinology

## 2017-10-01 ENCOUNTER — Encounter (HOSPITAL_COMMUNITY)
Admission: RE | Admit: 2017-10-01 | Discharge: 2017-10-01 | Disposition: A | Discharge status: Home / Self Care | Payer: No Typology Code available for payment source | Source: Ambulatory Visit | Attending: Endocrinology | Admitting: Endocrinology

## 2017-10-01 ENCOUNTER — Telehealth: Payer: Self-pay | Admitting: Family Medicine

## 2017-10-01 DIAGNOSIS — D440 Neoplasm of uncertain behavior of thyroid gland: Secondary | ICD-10-CM | POA: Diagnosis not present

## 2017-10-01 DIAGNOSIS — Z9889 Other specified postprocedural states: Secondary | ICD-10-CM | POA: Insufficient documentation

## 2017-10-01 NOTE — Telephone Encounter (Signed)
Copied from [place] [id number]. Topic: Quick Communication - See Telephone Encounter >> Oct 01, 2017 10:05 AM Ivar Drape wrote: CRM for notification. See Telephone encounter for:  10/01/17. Mariann Laster w/[facility] would like a call back concerning the patient's Bone Scan thats scheduled for today.  She was told to call Johny Shock.  She needs a prior authorization before they can perform the scan.  Please call [phone].

## 2017-10-31 MED FILL — SYNTHROID 100 MCG TABLET: 100 | 30 days supply | Qty: 30 | Fill #3

## 2017-11-12 ENCOUNTER — Ambulatory Visit: Payer: No Typology Code available for payment source | Admitting: Endocrinology

## 2017-11-12 ENCOUNTER — Encounter: Payer: Self-pay | Admitting: Endocrinology

## 2017-11-12 VITALS — BP 114/70 | HR 67 | Wt 203.4 lb

## 2017-11-12 DIAGNOSIS — D440 Neoplasm of uncertain behavior of thyroid gland: Secondary | ICD-10-CM | POA: Diagnosis not present

## 2017-11-12 DIAGNOSIS — E890 Postprocedural hypothyroidism: Secondary | ICD-10-CM

## 2017-11-12 LAB — TSH: TSH: 28.49 u[IU]/mL — AB (ref 0.35–4.50)

## 2017-11-12 MED ORDER — LEVOTHYROXINE SODIUM 150 MCG PO TABS: 150.0000 ug | ORAL_TABLET | Freq: Every day | ORAL | 2 refills | Status: DC

## 2017-11-12 NOTE — Patient Instructions (Signed)
blood tests are requested for you today.  We'll let you know about the results. Please come back for a follow-up appointment in 4 months.     

## 2017-11-12 NOTE — Progress Notes (Signed)
Subjective:    Patient ID: Suzanne Green, female    DOB: Apr 06, 1976, 42 y.o.   MRN: [id number]  HPI Pt returns for f/u of stage 1 papillary thyroid cancer, with this chronology: 11/18: thyroidectomy: Pathologyshowed MULTIFOCAL FOLLICULAR VARIANT OF PAPILLARY CARCINOMA, LARGEST FOCUS 2.5 CM, LIMITED TO THYROID GLAND. 2/19: RAI, 30 mCi 2/19: post-therapy scan: Small amount of residual tracer accumulation within the cervical region consistent with thyroid remnant. pt states she feels well in general, except for fatigue.   Past Medical History:  Diagnosis Date  . Anemia 2017  . GERD (gastroesophageal reflux disease)   . Hernia, hiatal 2017  . Migraines    since 1998  . Plantar fasciitis     Past Surgical History:  Procedure Laterality Date  . abdominal cyst removed  04/2002  . BREAST IMPLANT EXCHANGE  2016  . BREATH TEK H PYLORI  09/18/2011   Procedure: BREATH TEK H PYLORI;  Surgeon: Shann Medal, MD;  Location: [facility];  Service: General;  Laterality: N/A;  . CESAREAN SECTION  12/26/07, 10/31/04  . GASTRIC ROUX-EN-Y  12/19/2011   Procedure: LAPAROSCOPIC ROUX-EN-Y GASTRIC;  Surgeon: Shann Medal, MD;  Location: [facility];  Service: General;  Laterality: N/A;  . THYROIDECTOMY N/A 06/21/2017   Procedure: TOTAL THYROIDECTOMY WITH LIMITED LYMPH NODE DISSECTION;  Surgeon: Armandina Gemma, MD;  Location: [facility];  Service: General;  Laterality: N/A;  . TUBAL LIGATION  12/2007    Social History   Socioeconomic History  . Marital status: Legally Separated    Spouse name: Not on file  . Number of children: Not on file  . Years of education: Not on file  . Highest education level: Not on file  Occupational History  . Not on file  Social Needs  . Financial resource strain: Not on file  . Food insecurity:    Worry: Not on file    Inability: Not on file  . Transportation needs:    Medical: Not on file    Non-medical: Not on file  Tobacco Use  . Smoking status: Never Smoker  .  Smokeless tobacco: Never Used  Substance and Sexual Activity  . Alcohol use: No  . Drug use: No  . Sexual activity: Never  Lifestyle  . Physical activity:    Days per week: Not on file    Minutes per session: Not on file  . Stress: Not on file  Relationships  . Social connections:    Talks on phone: Not on file    Gets together: Not on file    Attends religious service: Not on file    Active member of club or organization: Not on file    Attends meetings of clubs or organizations: Not on file    Relationship status: Not on file  . Intimate partner violence:    Fear of current or ex partner: Not on file    Emotionally abused: Not on file    Physically abused: Not on file    Forced sexual activity: Not on file  Other Topics Concern  . Not on file  Social History Narrative  . Not on file    Current Outpatient Medications on File Prior to Visit  Medication Sig Dispense Refill  . EPINEPHrine (EPI-PEN) 0.3 mg/0.3 mL DEVI Inject 0.3 mg into the muscle once.     . ferrous sulfate 325 (65 FE) MG tablet Take 325 mg by mouth daily with breakfast.    . ibuprofen (ADVIL,MOTRIN) 600 MG tablet  Take 1 tablet (600 mg total) by mouth every 6 (six) hours as needed. (Patient taking differently: Take 600 mg by mouth every 6 (six) hours as needed for headache or moderate pain. ) 30 tablet 0  . Multiple Vitamin (MULTIVITAMIN WITH MINERALS) TABS tablet Take 1 tablet by mouth daily.    . ondansetron (ZOFRAN ODT) 4 MG disintegrating tablet Take 1 tablet (4 mg total) by mouth every 8 (eight) hours as needed for nausea or vomiting. 10 tablet 0  . oxyCODONE-acetaminophen (PERCOCET/ROXICET) 5-325 MG tablet Take 1-2 tablets by mouth every 6 (six) hours as needed for severe pain. 20 tablet 0  . rizatriptan (MAXALT) 10 MG tablet Take 10 mg by mouth as needed for migraine. May repeat in 2 hours if needed    . ranitidine (ZANTAC) 150 MG tablet Take 150 mg by mouth 2 (two) times daily as needed for heartburn.      . tamsulosin (FLOMAX) 0.4 MG CAPS capsule Take 1 capsule (0.4 mg total) by mouth daily. (Patient not taking: Reported on 11/12/2017) 7 capsule 0   No current facility-administered medications on file prior to visit.     Allergies  Allergen Reactions  . Bee Venom Anaphylaxis    History reviewed. No pertinent family history.  BP 114/70 (BP Location: Left Arm, Patient Position: Sitting, Cuff Size: Normal)   Pulse 67   Wt 203 lb 6.4 oz (92.3 kg)   SpO2 97%   BMI 34.91 kg/m   Review of Systems Denies neck pain    Objective:   Physical Exam VITAL SIGNS:  See vs page GENERAL: no distress Neck: a healed scar is present.  I do not appreciate a nodule in the thyroid or elsewhere in the neck   Lab Results  Component Value Date   TSH 28.49 (H) 11/12/2017       Assessment & Plan:  Postsurgical hypothyroidism.  She needs increased rx.  Differentiated thyroid cancer: we'll check TG next time.

## 2017-11-19 MED FILL — IBUPROFEN 800 MG TAB: 800 | 6 days supply | Qty: 20 | Fill #0

## 2017-11-20 MED FILL — OXYCODONE-ACETAMINOPHEN 5-3: 5-325 | 4 days supply | Qty: 20 | Fill #0

## 2018-01-01 MED FILL — LEVOTHYROXINE 150 MCG TAB: 150 | 30 days supply | Qty: 30 | Fill #0

## 2018-01-08 ENCOUNTER — Other Ambulatory Visit (INDEPENDENT_AMBULATORY_CARE_PROVIDER_SITE_OTHER): Payer: No Typology Code available for payment source

## 2018-01-08 ENCOUNTER — Telehealth: Payer: Self-pay | Admitting: Endocrinology

## 2018-01-08 ENCOUNTER — Other Ambulatory Visit: Payer: Self-pay | Admitting: Endocrinology

## 2018-01-08 ENCOUNTER — Other Ambulatory Visit: Payer: Self-pay

## 2018-01-08 DIAGNOSIS — E890 Postprocedural hypothyroidism: Secondary | ICD-10-CM

## 2018-01-08 LAB — TSH: TSH: 10.06 u[IU]/mL — ABNORMAL HIGH (ref 0.35–4.50)

## 2018-01-08 MED ORDER — LEVOTHYROXINE SODIUM 175 MCG PO TABS: 175.0000 ug | ORAL_TABLET | Freq: Every day | ORAL | 5 refills | Status: DC

## 2018-01-08 MED ORDER — LEVOTHYROXINE SODIUM 150 MCG PO TABS: 150.0000 ug | ORAL_TABLET | Freq: Every day | ORAL | 2 refills | Status: DC

## 2018-01-08 NOTE — Telephone Encounter (Signed)
Patient uses  [facility]- please send all scripts to the above pharmacy (insurance no longer covers [facility] so please remove [facility])

## 2018-01-08 NOTE — Telephone Encounter (Signed)
I have sent synthroid to [facility] also deleted [facility] out of patient's chart.

## 2018-01-09 MED FILL — LEVOTHYROXINE 175 MCG TAB: 175 | 30 days supply | Qty: 30 | Fill #0

## 2018-02-12 MED FILL — LEVOTHYROXINE 175 MCG TAB: 175 | 30 days supply | Qty: 30 | Fill #1

## 2018-03-12 MED FILL — RIZATRIPTAN BENZOATE 10 MG: 10 | 10 days supply | Qty: 6 | Fill #0

## 2018-03-12 MED FILL — EPINEPHRINE 0.3 MG AUTO-INJ: 0.3 | 30 days supply | Qty: 2 | Fill #0

## 2018-03-18 ENCOUNTER — Encounter: Payer: Self-pay | Admitting: Endocrinology

## 2018-03-18 ENCOUNTER — Ambulatory Visit (INDEPENDENT_AMBULATORY_CARE_PROVIDER_SITE_OTHER): Payer: No Typology Code available for payment source | Admitting: Endocrinology

## 2018-03-18 VITALS — BP 93/64 | HR 65 | Ht 64.0 in | Wt 201.0 lb

## 2018-03-18 DIAGNOSIS — D440 Neoplasm of uncertain behavior of thyroid gland: Secondary | ICD-10-CM | POA: Diagnosis not present

## 2018-03-18 DIAGNOSIS — E890 Postprocedural hypothyroidism: Secondary | ICD-10-CM

## 2018-03-18 LAB — VITAMIN D 25 HYDROXY (VIT D DEFICIENCY, FRACTURES): VITD: 15.99 ng/mL — ABNORMAL LOW (ref 30.00–100.00)

## 2018-03-18 LAB — TSH: TSH: 0.2 u[IU]/mL — AB (ref 0.35–4.50)

## 2018-03-18 NOTE — Progress Notes (Signed)
Subjective:    Patient ID: Suzanne Green, female    DOB: 12/06/1975, 42 y.o.   MRN: [id number]  HPI Pt returns for f/u of stage 1 papillary thyroid cancer, with this chronology: 11/18: thyroidectomy: Pathologyshowed MULTIFOCAL FOLLICULAR VARIANT OF PAPILLARY CARCINOMA, LARGEST FOCUS 2.5 CM, LIMITED TO THYROID GLAND. 2/19: RAI, 30 mCi, at pt's request.   2/19: post-therapy scan: Small amount of residual tracer accumulation within the cervical region consistent with thyroid remnant.   Mild hypocalcemia was noted in 2017, but not after 2018 thyroidectomy.   pt states slight solid-only dysphagia sensation in the neck, but no assoc pain.   Past Medical History:  Diagnosis Date  . Anemia 2017  . GERD (gastroesophageal reflux disease)   . Hernia, hiatal 2017  . Migraines    since 1998  . Plantar fasciitis     Past Surgical History:  Procedure Laterality Date  . abdominal cyst removed  04/2002  . BREAST IMPLANT EXCHANGE  2016  . BREATH TEK H PYLORI  09/18/2011   Procedure: BREATH TEK H PYLORI;  Surgeon: Shann Medal, MD;  Location: [facility];  Service: General;  Laterality: N/A;  . CESAREAN SECTION  12/26/07, 10/31/04  . GASTRIC ROUX-EN-Y  12/19/2011   Procedure: LAPAROSCOPIC ROUX-EN-Y GASTRIC;  Surgeon: Shann Medal, MD;  Location: [facility];  Service: General;  Laterality: N/A;  . THYROIDECTOMY N/A 06/21/2017   Procedure: TOTAL THYROIDECTOMY WITH LIMITED LYMPH NODE DISSECTION;  Surgeon: Armandina Gemma, MD;  Location: [facility];  Service: General;  Laterality: N/A;  . TUBAL LIGATION  12/2007    Social History   Socioeconomic History  . Marital status: Legally Separated    Spouse name: Not on file  . Number of children: Not on file  . Years of education: Not on file  . Highest education level: Not on file  Occupational History  . Not on file  Social Needs  . Financial resource strain: Not on file  . Food insecurity:    Worry: Not on file    Inability: Not on file  .  Transportation needs:    Medical: Not on file    Non-medical: Not on file  Tobacco Use  . Smoking status: Never Smoker  . Smokeless tobacco: Never Used  Substance and Sexual Activity  . Alcohol use: No  . Drug use: No  . Sexual activity: Never  Lifestyle  . Physical activity:    Days per week: Not on file    Minutes per session: Not on file  . Stress: Not on file  Relationships  . Social connections:    Talks on phone: Not on file    Gets together: Not on file    Attends religious service: Not on file    Active member of club or organization: Not on file    Attends meetings of clubs or organizations: Not on file    Relationship status: Not on file  . Intimate partner violence:    Fear of current or ex partner: Not on file    Emotionally abused: Not on file    Physically abused: Not on file    Forced sexual activity: Not on file  Other Topics Concern  . Not on file  Social History Narrative  . Not on file    Current Outpatient Medications on File Prior to Visit  Medication Sig Dispense Refill  . EPINEPHrine (EPI-PEN) 0.3 mg/0.3 mL DEVI Inject 0.3 mg into the muscle once.     . ferrous  sulfate 325 (65 FE) MG tablet Take 325 mg by mouth daily with breakfast.    . ibuprofen (ADVIL,MOTRIN) 600 MG tablet Take 1 tablet (600 mg total) by mouth every 6 (six) hours as needed. (Patient taking differently: Take 600 mg by mouth every 6 (six) hours as needed for headache or moderate pain. ) 30 tablet 0  . levothyroxine (SYNTHROID, LEVOTHROID) 175 MCG tablet Take 1 tablet (175 mcg total) by mouth daily before breakfast. 30 tablet 5  . Multiple Vitamin (MULTIVITAMIN WITH MINERALS) TABS tablet Take 1 tablet by mouth daily.    . ondansetron (ZOFRAN ODT) 4 MG disintegrating tablet Take 1 tablet (4 mg total) by mouth every 8 (eight) hours as needed for nausea or vomiting. 10 tablet 0  . rizatriptan (MAXALT) 10 MG tablet Take 10 mg by mouth as needed for migraine. May repeat in 2 hours if needed     . oxyCODONE-acetaminophen (PERCOCET/ROXICET) 5-325 MG tablet Take 1-2 tablets by mouth every 6 (six) hours as needed for severe pain. (Patient not taking: Reported on 03/18/2018) 20 tablet 0   No current facility-administered medications on file prior to visit.     Allergies  Allergen Reactions  . Bee Venom Anaphylaxis    No family history on file.  BP 93/64 (BP Location: Left Arm, Patient Position: Sitting, Cuff Size: Normal)   Pulse 65   Ht 5\' 4"  (1.626 m)   Wt 201 lb (91.2 kg)   BMI 34.50 kg/m    Review of Systems Denies neck swelling, but she has fatigue.      Objective:   Physical Exam VITAL SIGNS:  See vs page GENERAL: no distress Neck: a healed scar is present.  I do not appreciate a nodule in the thyroid or elsewhere in the neck.       Assessment & Plan:  Differentiated thyroid cancer: due for recheck Postsurgical hypothyroidism: recheck today Hypocalcemia: usually due to vit-D def, in a patient with h/o bariatric surgery Dysphagia, new.  Unlikely thyroid-related.  We discussed checking [place] vs Ba swallow vs holding off on imaging for now.    Patient Instructions  blood tests are requested for you today.  We'll let you know about the results. Please come back for a follow-up appointment in 6-12 months.

## 2018-03-18 NOTE — Patient Instructions (Signed)
blood tests are requested for you today.  We'll let you know about the results.   Please come back for a follow-up appointment in 6-12 months.   

## 2018-03-20 LAB — THYROGLOBULIN LEVEL

## 2018-03-20 LAB — PTH, INTACT AND CALCIUM
CALCIUM: 8.7 mg/dL (ref 8.6–10.2)
PTH: 125 pg/mL — ABNORMAL HIGH (ref 14–64)

## 2018-03-20 LAB — THYROGLOBULIN ANTIBODY

## 2018-03-25 MED FILL — LEVOTHYROXINE 175 MCG TAB: 175 | 30 days supply | Qty: 30 | Fill #2

## 2018-05-06 MED FILL — LEVOTHYROXINE 175 MCG TAB: 175 | 30 days supply | Qty: 30 | Fill #3

## 2018-06-17 MED FILL — LEVOTHYROXINE 175 MCG TAB: 175 | 30 days supply | Qty: 30 | Fill #4

## 2018-07-17 MED FILL — LEVOTHYROXINE 175 MCG TAB: 175 | 30 days supply | Qty: 30 | Fill #5

## 2018-07-25 ENCOUNTER — Encounter (HOSPITAL_COMMUNITY): Payer: Self-pay

## 2018-08-11 IMAGING — CT CT ABD-PELV W/ CM
2 of 4 series · 16 of 46 positions shown, 18 images · IV contrast (ISOVUE)
Comparison: CT abdomen pelvis 09/18/2014

CLINICAL DATA: Right lower quadrant pain radiating to the lower
back

EXAM:
CT ABDOMEN AND PELVIS WITH CONTRAST
TECHNIQUE: Multidetector CT imaging of the abdomen and pelvis was performed
using the standard protocol following bolus administration of
intravenous contrast.
CONTRAST:  100mL D1WEMJ-7XX IOPAMIDOL (D1WEMJ-7XX) INJECTION 61%

[Series 2: abd/pel with · axial · 0.73mm/px · z∈[+1144,+1550]mm · 13 of 91 slices shown, 15 images]
[im 5/91  soft-tissue]
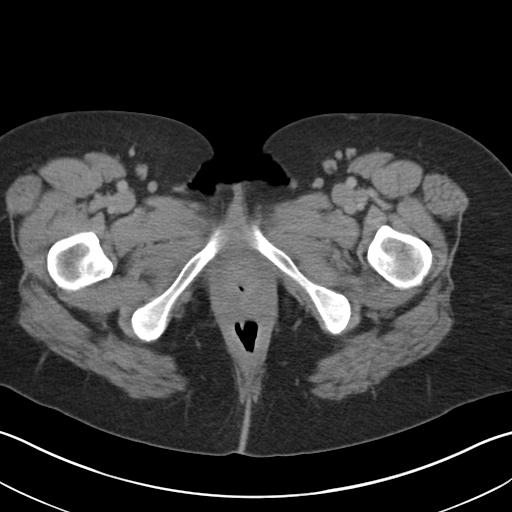
[im 5/91  bone]
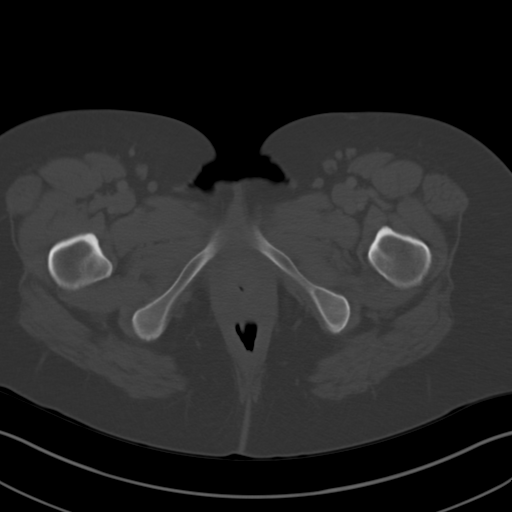
[im 14/91  soft-tissue]
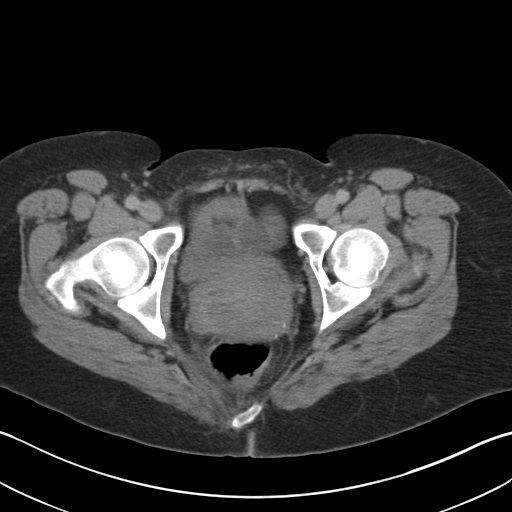
[im 19/91  soft-tissue]
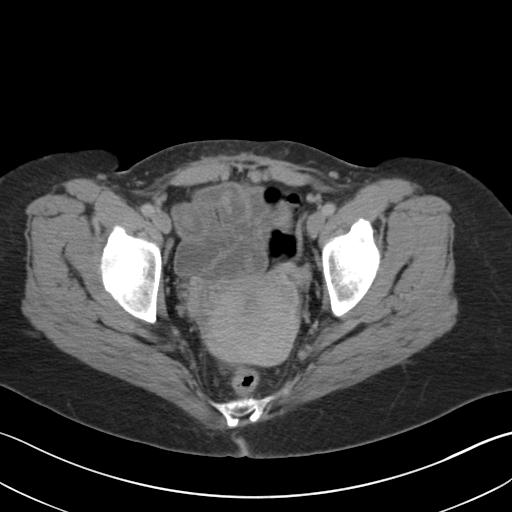
[im 28/91  soft-tissue]
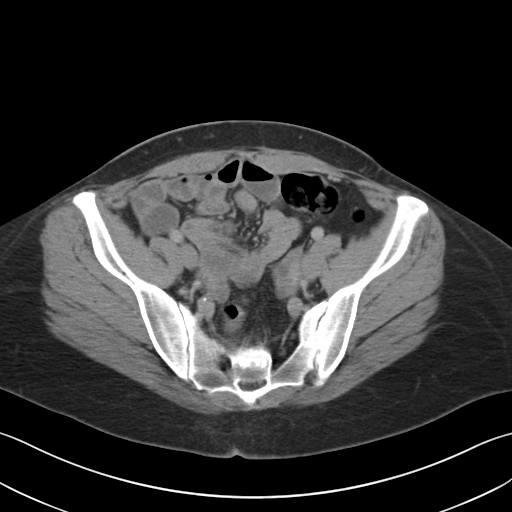
[im 32/91  soft-tissue]
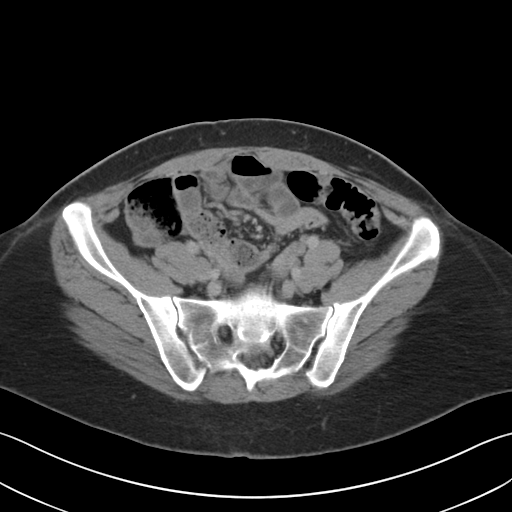
[im 41/91  soft-tissue]
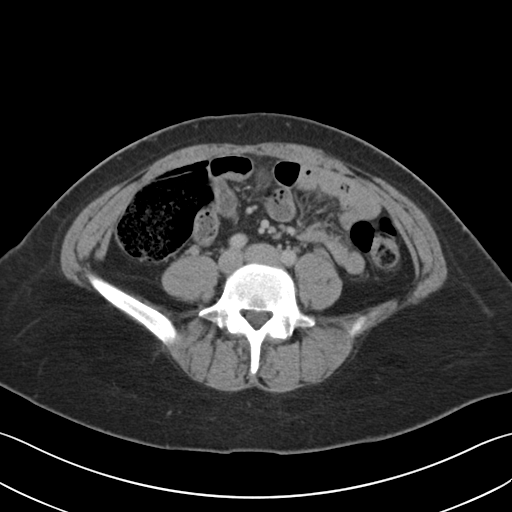
[im 46/91  soft-tissue]
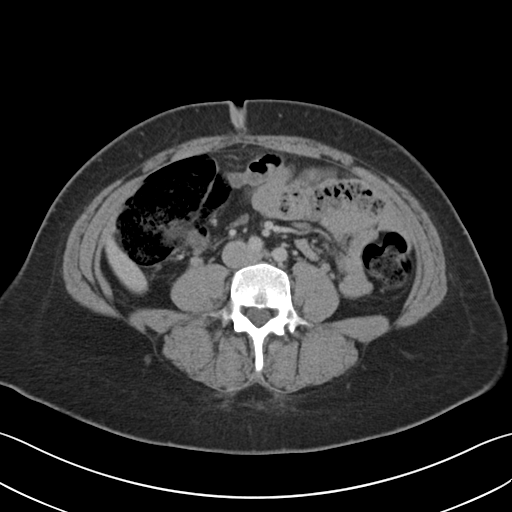
[im 50/91  soft-tissue]
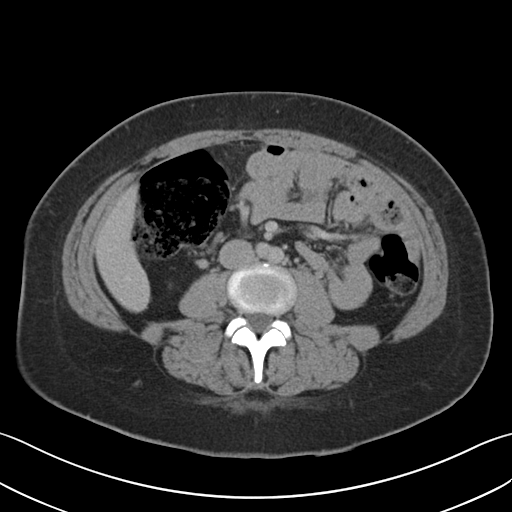
[im 59/91  soft-tissue]
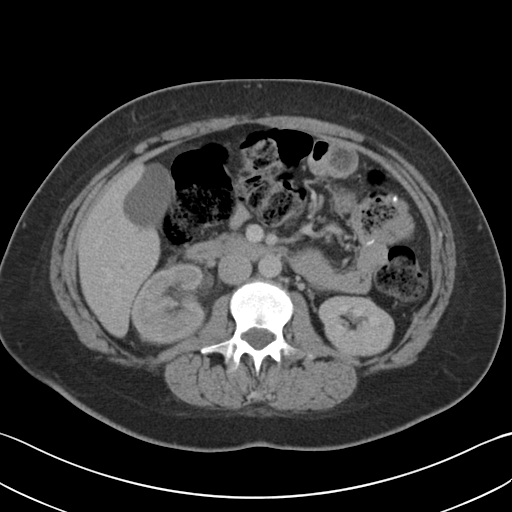
[im 59/91  bone]
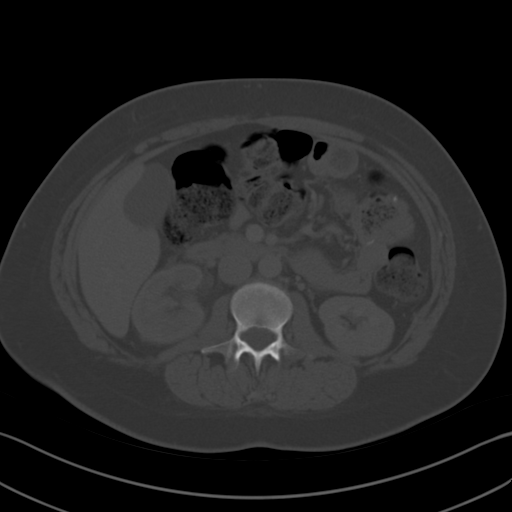
[im 64/91  soft-tissue]
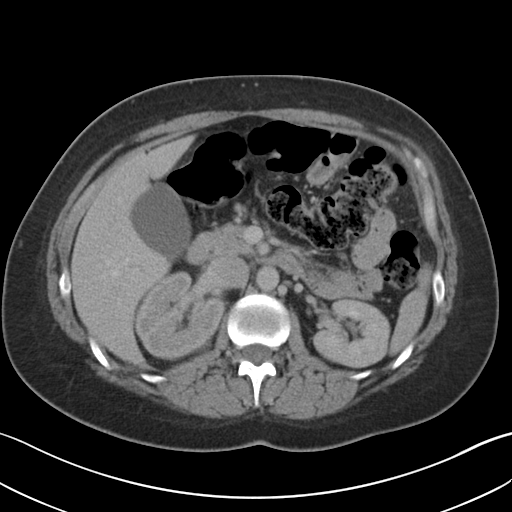
[im 73/91  soft-tissue]
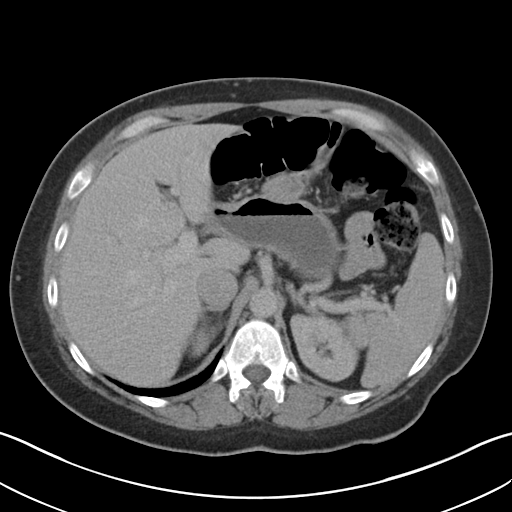
[im 77/91  soft-tissue]
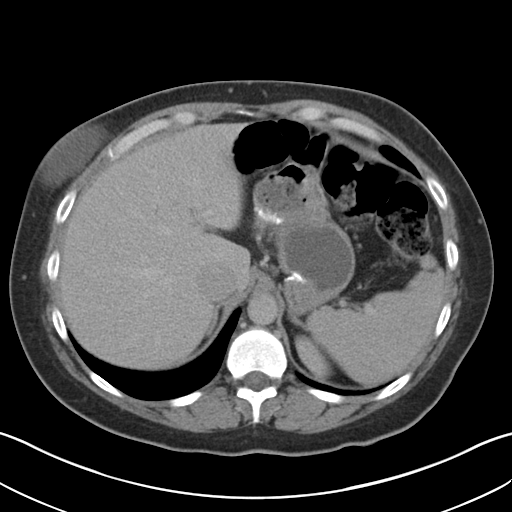
[im 86/91  soft-tissue]
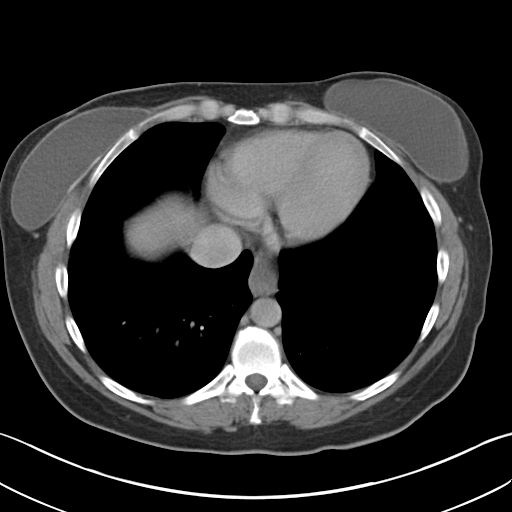

[Series 4: coronal a/|p · coronal · 0.74mm/px · 3 of 133 slices shown]
[im 45/133  soft-tissue]
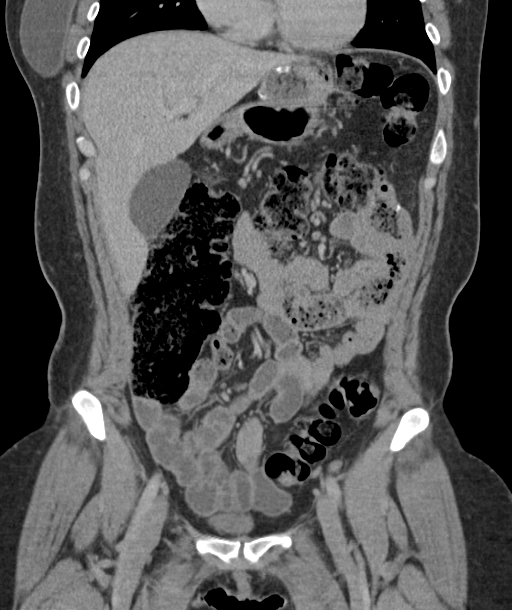
[im 59/133  soft-tissue]
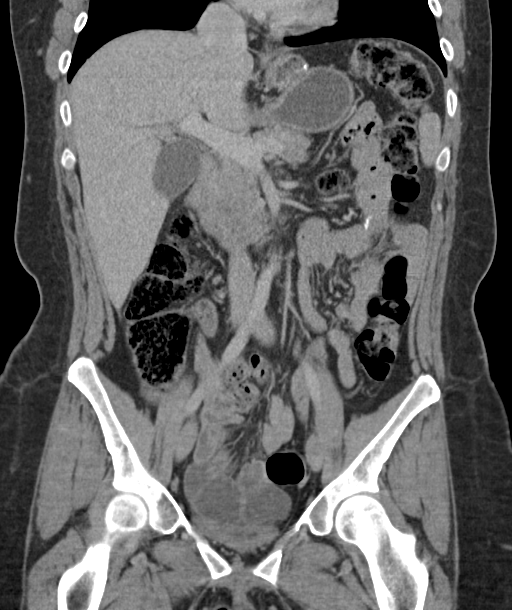
[im 74/133  soft-tissue]
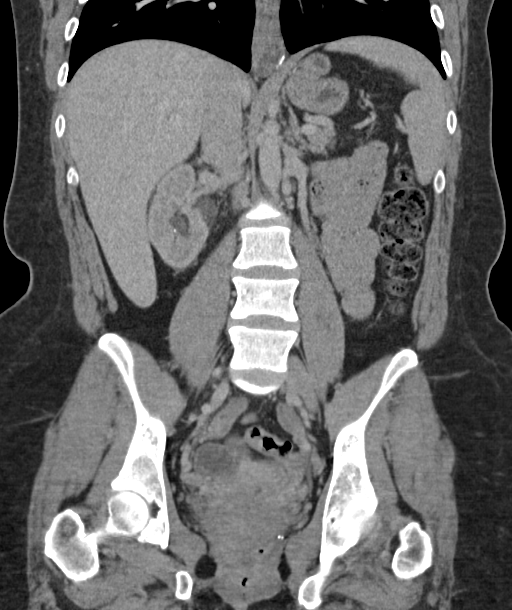

[16 of 46 positions shown; findings below may reference images not displayed]

FINDINGS: Lower chest: No pulmonary nodules. No visible pleural or pericardial
effusion. Bilateral breast implants.

Hepatobiliary: Normal hepatic size and contours without focal liver
lesion. No perihepatic ascites. No intra- or extrahepatic biliary
dilatation. Normal gallbladder.

Pancreas: Normal pancreatic contours and enhancement. No
peripancreatic fluid collection or pancreatic ductal dilatation.

Spleen: Normal.

Adrenals/Urinary Tract: Normal adrenal glands. Left renal cyst
measures 1.5 cm. There is right hydronephrosis and perinephric
stranding with a 4 mm calculus within the proximal right ureter at
the ureteropelvic junction.

Stomach/Bowel: Status post gastric bypass. No evidence of acute GI
inflammation. No dilated small bowel. Anastomotic suture line noted
in the left abdomen. Large amount of stool within the hepatic
flexure. Appendix not visualized. No abdominal fluid collection. No
pneumobilia or portal venous gas. Small hiatal hernia.

Vascular/Lymphatic: Normal course and caliber of the major abdominal
vessels. No abdominal or pelvic adenopathy.

Reproductive: Uterus and ovaries are normal.

Musculoskeletal: No lytic or blastic osseous lesion. Normal
visualized extrathoracic and extraperitoneal soft tissues.

Other: No contributory non-categorized findings.
IMPRESSION: 1. Right obstructive uropathy with 4 mm calculus at the right
ureteropelvic junction and resultant moderate right hydronephrosis
and mild perinephric stranding.
2. Status post gastric bypass.  Small hiatal hernia.

## 2018-09-10 ENCOUNTER — Ambulatory Visit (INDEPENDENT_AMBULATORY_CARE_PROVIDER_SITE_OTHER): Payer: No Typology Code available for payment source | Admitting: Endocrinology

## 2018-09-10 ENCOUNTER — Encounter: Payer: Self-pay | Admitting: Endocrinology

## 2018-09-10 DIAGNOSIS — E890 Postprocedural hypothyroidism: Secondary | ICD-10-CM

## 2018-09-10 DIAGNOSIS — D440 Neoplasm of uncertain behavior of thyroid gland: Secondary | ICD-10-CM

## 2018-09-10 LAB — VITAMIN D 25 HYDROXY (VIT D DEFICIENCY, FRACTURES): VITD: 15.23 ng/mL — ABNORMAL LOW (ref 30.00–100.00)

## 2018-09-10 LAB — T4, FREE: Free T4: 0.71 ng/dL (ref 0.60–1.60)

## 2018-09-10 LAB — TSH: TSH: 6.07 u[IU]/mL — ABNORMAL HIGH (ref 0.35–4.50)

## 2018-09-10 MED ORDER — LEVOTHYROXINE SODIUM 200 MCG PO TABS: 200.0000 ug | ORAL_TABLET | Freq: Every day | ORAL | 3 refills | Status: DC

## 2018-09-10 NOTE — Progress Notes (Signed)
Subjective:    Patient ID: Suzanne Green, female    DOB: November 13, 1975, 43 y.o.   MRN: [id number]  HPI Pt returns for f/u of stage 1 papillary thyroid cancer, with this chronology: 11/18: thyroidectomy: Pathologyshowed MULTIFOCAL FOLLICULAR VARIANT OF PAPILLARY CARCINOMA, LARGEST FOCUS 2.5 CM, LIMITED TO THYROID GLAND. 2/19: RAI, 30 mCi, at pt's request.   2/19: post-therapy scan: Small amount of residual tracer accumulation within the cervical region consistent with thyroid remnant.   Mild hypocalcemia was noted in 2017, but not after 2018 thyroidectomy.  She takes vit-D, 3000 units/day.   pt states slight swelling at the left ant neck, but no assoc pain.  Past Medical History:  Diagnosis Date  . Anemia 2017  . GERD (gastroesophageal reflux disease)   . Hernia, hiatal 2017  . Migraines    since 1998  . Plantar fasciitis     Past Surgical History:  Procedure Laterality Date  . abdominal cyst removed  04/2002  . BREAST IMPLANT EXCHANGE  2016  . BREATH TEK H PYLORI  09/18/2011   Procedure: BREATH TEK H PYLORI;  Surgeon: Shann Medal, MD;  Location: [facility];  Service: General;  Laterality: N/A;  . CESAREAN SECTION  12/26/07, 10/31/04  . GASTRIC ROUX-EN-Y  12/19/2011   Procedure: LAPAROSCOPIC ROUX-EN-Y GASTRIC;  Surgeon: Shann Medal, MD;  Location: [facility];  Service: General;  Laterality: N/A;  . THYROIDECTOMY N/A 06/21/2017   Procedure: TOTAL THYROIDECTOMY WITH LIMITED LYMPH NODE DISSECTION;  Surgeon: Armandina Gemma, MD;  Location: [facility];  Service: General;  Laterality: N/A;  . TUBAL LIGATION  12/2007    Social History   Socioeconomic History  . Marital status: Legally Separated    Spouse name: Not on file  . Number of children: Not on file  . Years of education: Not on file  . Highest education level: Not on file  Occupational History  . Not on file  Social Needs  . Financial resource strain: Not on file  . Food insecurity:    Worry: Not on file    Inability: Not on  file  . Transportation needs:    Medical: Not on file    Non-medical: Not on file  Tobacco Use  . Smoking status: Never Smoker  . Smokeless tobacco: Never Used  Substance and Sexual Activity  . Alcohol use: No  . Drug use: No  . Sexual activity: Never  Lifestyle  . Physical activity:    Days per week: Not on file    Minutes per session: Not on file  . Stress: Not on file  Relationships  . Social connections:    Talks on phone: Not on file    Gets together: Not on file    Attends religious service: Not on file    Active member of club or organization: Not on file    Attends meetings of clubs or organizations: Not on file    Relationship status: Not on file  . Intimate partner violence:    Fear of current or ex partner: Not on file    Emotionally abused: Not on file    Physically abused: Not on file    Forced sexual activity: Not on file  Other Topics Concern  . Not on file  Social History Narrative  . Not on file    Current Outpatient Medications on File Prior to Visit  Medication Sig Dispense Refill  . EPINEPHrine (EPI-PEN) 0.3 mg/0.3 mL DEVI Inject 0.3 mg into the muscle once.     Marland Kitchen  ferrous sulfate 325 (65 FE) MG tablet Take 325 mg by mouth daily with breakfast.    . ibuprofen (ADVIL,MOTRIN) 600 MG tablet Take 1 tablet (600 mg total) by mouth every 6 (six) hours as needed. (Patient taking differently: Take 600 mg by mouth every 6 (six) hours as needed for headache or moderate pain. ) 30 tablet 0  . Multiple Vitamin (MULTIVITAMIN WITH MINERALS) TABS tablet Take 1 tablet by mouth daily.    . rizatriptan (MAXALT) 10 MG tablet Take 10 mg by mouth as needed for migraine. May repeat in 2 hours if needed     No current facility-administered medications on file prior to visit.     Allergies  Allergen Reactions  . Bee Venom Anaphylaxis    No family history on file.  BP 120/78 (BP Location: Left Arm, Patient Position: Sitting, Cuff Size: Large)   Pulse 66   Ht 5\' 4"   (1.626 m)   Wt 210 lb (95.3 kg)   SpO2 97%   BMI 36.05 kg/m    Review of Systems Denies numbness of the legs, but she has swelling there.  She has gained a few lbs.      Objective:   Physical Exam VITAL SIGNS:  See vs page.  GENERAL: no distress.  Neck: a healed scar is present.  I do not appreciate a nodule in the thyroid or elsewhere in the neck.   TG is neg Lab Results  Component Value Date   TSH 6.07 (H) 09/10/2018  25-OH vit-D is low    Assessment & Plan:  Neck swelling: in view of neg TG, we'll hold off on [place] for now. Hypothyroidism: she needs increased rx.  I have sent a prescription to your pharmacy Vit-D def.  She needs increased rx.  Pt is advised to increase  Patient Instructions  blood tests are requested for you today.  We'll let you know about the results.  Please come back for a follow-up appointment in 6-12 months.

## 2018-09-10 NOTE — Patient Instructions (Signed)
blood tests are requested for you today.  We'll let you know about the results.   Please come back for a follow-up appointment in 6-12 months.   

## 2018-09-11 LAB — THYROGLOBULIN LEVEL: Thyroglobulin: <0.1 ng/mL — ABNORMAL LOW

## 2018-09-11 LAB — PTH, INTACT AND CALCIUM
Calcium: 8.8 mg/dL (ref 8.6–10.2)
PTH: 80 pg/mL — ABNORMAL HIGH (ref 14–64)

## 2018-09-11 LAB — THYROGLOBULIN ANTIBODY: Thyroglobulin Ab: <1 IU/mL (ref <=1)

## 2018-09-11 MED FILL — LEVOTHYROXINE 200 MCG TAB: 200 | 90 days supply | Qty: 90 | Fill #0

## 2018-12-17 MED FILL — EPINEPHRINE 0.3 MG AUTO-INJ: 0.3 | 30 days supply | Qty: 2 | Fill #1

## 2018-12-17 MED FILL — LEVOTHYROXINE 200 MCG TAB: 200 | 90 days supply | Qty: 90 | Fill #1

## 2019-03-21 ENCOUNTER — Other Ambulatory Visit: Payer: Self-pay

## 2019-03-24 MED FILL — LEVOTHYROXINE 200 MCG TAB: 200 | 90 days supply | Qty: 90 | Fill #2

## 2019-03-25 ENCOUNTER — Ambulatory Visit: Payer: No Typology Code available for payment source | Admitting: Endocrinology

## 2019-03-25 ENCOUNTER — Encounter: Payer: Self-pay | Admitting: Endocrinology

## 2019-03-25 ENCOUNTER — Other Ambulatory Visit: Payer: Self-pay

## 2019-03-25 DIAGNOSIS — D440 Neoplasm of uncertain behavior of thyroid gland: Secondary | ICD-10-CM | POA: Diagnosis not present

## 2019-03-25 DIAGNOSIS — E890 Postprocedural hypothyroidism: Secondary | ICD-10-CM | POA: Diagnosis not present

## 2019-03-25 LAB — VITAMIN D 25 HYDROXY (VIT D DEFICIENCY, FRACTURES): VITD: 26.57 ng/mL — ABNORMAL LOW (ref 30.00–100.00)

## 2019-03-25 LAB — TSH: TSH: <0.01 u[IU]/mL — ABNORMAL LOW (ref 0.35–4.50)

## 2019-03-25 NOTE — Patient Instructions (Addendum)
Blood tests are requested for you today.  We'll let you know about the results.  Please let us know if you want to check the ultrasound, for your symptoms.  Please come back for a follow-up appointment in 6 months.

## 2019-03-25 NOTE — Progress Notes (Signed)
Subjective:    Patient ID: Suzanne Green, female    DOB: [birth year], 43 y.o.   MRN: [id number]  HPI Pt returns for f/u of stage 1 papillary thyroid cancer, with this chronology: 11/18: thyroidectomy: Pathologyshowed MULTIFOCAL FOLLICULAR VARIANT OF PAPILLARY CARCINOMA, LARGEST FOCUS 2.5 CM, LIMITED TO THYROID GLAND. 2/19: RAI, 30 mCi, at pt's request.   2/19: post-therapy scan: Small amount of residual tracer accumulation within the cervical region consistent with thyroid remnant.   2/20: TG is undetectable (Ab neg) Mild hypocalcemia was noted in 2017, but not after 2018 thyroidectomy.  She takes vit-D, at a uncertain dosage.   pt states slight intermitt solid dysphagia at the ant neck, but no assoc pain.   Past Medical History:  Diagnosis Date  . Anemia 2017  . GERD (gastroesophageal reflux disease)   . Hernia, hiatal 2017  . Migraines    since 1998  . Plantar fasciitis     Past Surgical History:  Procedure Laterality Date  . abdominal cyst removed  04/2002  . BREAST IMPLANT EXCHANGE  2016  . BREATH TEK H PYLORI  09/18/2011   Procedure: BREATH TEK H PYLORI;  Surgeon: Shann Medal, MD;  Location: [facility];  Service: General;  Laterality: N/A;  . CESAREAN SECTION  12/26/07, 10/31/04  . GASTRIC ROUX-EN-Y  12/19/2011   Procedure: LAPAROSCOPIC ROUX-EN-Y GASTRIC;  Surgeon: Shann Medal, MD;  Location: [facility];  Service: General;  Laterality: N/A;  . THYROIDECTOMY N/A 06/21/2017   Procedure: TOTAL THYROIDECTOMY WITH LIMITED LYMPH NODE DISSECTION;  Surgeon: Armandina Gemma, MD;  Location: [facility];  Service: General;  Laterality: N/A;  . TUBAL LIGATION  12/2007    Social History   Socioeconomic History  . Marital status: Legally Separated    Spouse name: Not on file  . Number of children: Not on file  . Years of education: Not on file  . Highest education level: Not on file  Occupational History  . Not on file  Social Needs  . Financial resource strain: Not on file  . Food  insecurity    Worry: Not on file    Inability: Not on file  . Transportation needs    Medical: Not on file    Non-medical: Not on file  Tobacco Use  . Smoking status: Never Smoker  . Smokeless tobacco: Never Used  Substance and Sexual Activity  . Alcohol use: No  . Drug use: No  . Sexual activity: Never  Lifestyle  . Physical activity    Days per week: Not on file    Minutes per session: Not on file  . Stress: Not on file  Relationships  . Social Herbalist on phone: Not on file    Gets together: Not on file    Attends religious service: Not on file    Active member of club or organization: Not on file    Attends meetings of clubs or organizations: Not on file    Relationship status: Not on file  . Intimate partner violence    Fear of current or ex partner: Not on file    Emotionally abused: Not on file    Physically abused: Not on file    Forced sexual activity: Not on file  Other Topics Concern  . Not on file  Social History Narrative  . Not on file    Current Outpatient Medications on File Prior to Visit  Medication Sig Dispense Refill  . EPINEPHrine (EPI-PEN) 0.3 mg/0.3  mL DEVI Inject 0.3 mg into the muscle once.     . ferrous sulfate 325 (65 FE) MG tablet Take 325 mg by mouth daily with breakfast.    . ibuprofen (ADVIL,MOTRIN) 600 MG tablet Take 1 tablet (600 mg total) by mouth every 6 (six) hours as needed. (Patient taking differently: Take 600 mg by mouth every 6 (six) hours as needed for headache or moderate pain. ) 30 tablet 0  . Multiple Vitamin (MULTIVITAMIN WITH MINERALS) TABS tablet Take 1 tablet by mouth daily.    . rizatriptan (MAXALT) 10 MG tablet Take 10 mg by mouth as needed for migraine. May repeat in 2 hours if needed     No current facility-administered medications on file prior to visit.     Allergies  Allergen Reactions  . Bee Venom Anaphylaxis    No family history on file.  BP 104/84 (BP Location: Left Arm, Patient Position:  Sitting, Cuff Size: Large)   Pulse 65   Ht _0  (1.626 m)   Wt 195 lb 6.4 oz (88.6 kg)   SpO2 97%   BMI 33.54 kg/m   Review of Systems Denies muscle cramps and numbness.     Objective:   Physical Exam VITAL SIGNS:  See vs page GENERAL: no distress Neck: a healed scar is present.  I do not appreciate a nodule in the thyroid or elsewhere in the neck.     TG=undetectable Vit-D=29 Lab Results  Component Value Date   TSH <0.01 Repeated and verified X2. (L) 03/25/2019      Assessment & Plan:  Neck sxs: we discussed: she declines [place] for now.  PTC: no evidence of recurrence. Vit-D def: better.  I advised pt to increase rx. Hypothyroidism: overreplaced.  I have sent a prescription to your pharmacy, to reduce  Patient Instructions  Blood tests are requested for you today.  We'll let you know about the results.  Please let us know if you want to check the ultrasound, for your symptoms.  Please come back for a follow-up appointment in 6 months.

## 2019-03-26 LAB — THYROGLOBULIN LEVEL: Thyroglobulin: <0.1 ng/mL — ABNORMAL LOW

## 2019-03-26 LAB — PTH, INTACT AND CALCIUM
Calcium: 9.7 mg/dL (ref 8.6–10.2)
PTH: 38 pg/mL (ref 14–64)

## 2019-03-26 LAB — THYROGLOBULIN ANTIBODY: Thyroglobulin Ab: <1 IU/mL (ref <=1)

## 2019-03-26 MED ORDER — LEVOTHYROXINE SODIUM 175 MCG PO TABS: 175.0000 ug | ORAL_TABLET | Freq: Every day | ORAL | 5 refills | Status: DC

## 2019-03-27 MED FILL — LEVOTHYROXINE 175 MCG TAB: 175 | 30 days supply | Qty: 30 | Fill #0

## 2019-03-28 DIAGNOSIS — D649 Anemia, unspecified: Secondary | ICD-10-CM | POA: Insufficient documentation

## 2019-03-28 DIAGNOSIS — G43909 Migraine, unspecified, not intractable, without status migrainosus: Secondary | ICD-10-CM | POA: Insufficient documentation

## 2019-04-29 MED FILL — LEVOTHYROXINE 175 MCG TAB: 175 | 30 days supply | Qty: 30 | Fill #1

## 2019-06-04 MED FILL — LEVOTHYROXINE 175 MCG TAB: 175 | 30 days supply | Qty: 30 | Fill #2

## 2019-06-16 MED FILL — LEVOTHYROXINE 175 MCG TAB: 175 | 30 days supply | Qty: 30 | Fill #2

## 2019-07-17 MED FILL — LEVOTHYROXINE 175 MCG TAB: 175 | 30 days supply | Qty: 30 | Fill #3

## 2019-08-08 DIAGNOSIS — U071 COVID-19: Secondary | ICD-10-CM

## 2019-08-08 HISTORY — DX: COVID-19: U07.1

## 2019-08-15 IMAGING — CR DG CHEST 2V
2 series · 2 of 2 positions shown · non-contrast
Comparison: 09/02/2006

CLINICAL DATA: Preoperative evaluation for upcoming left thyroid
Sir

EXAM:
CHEST  2 VIEW

[w chest pa]
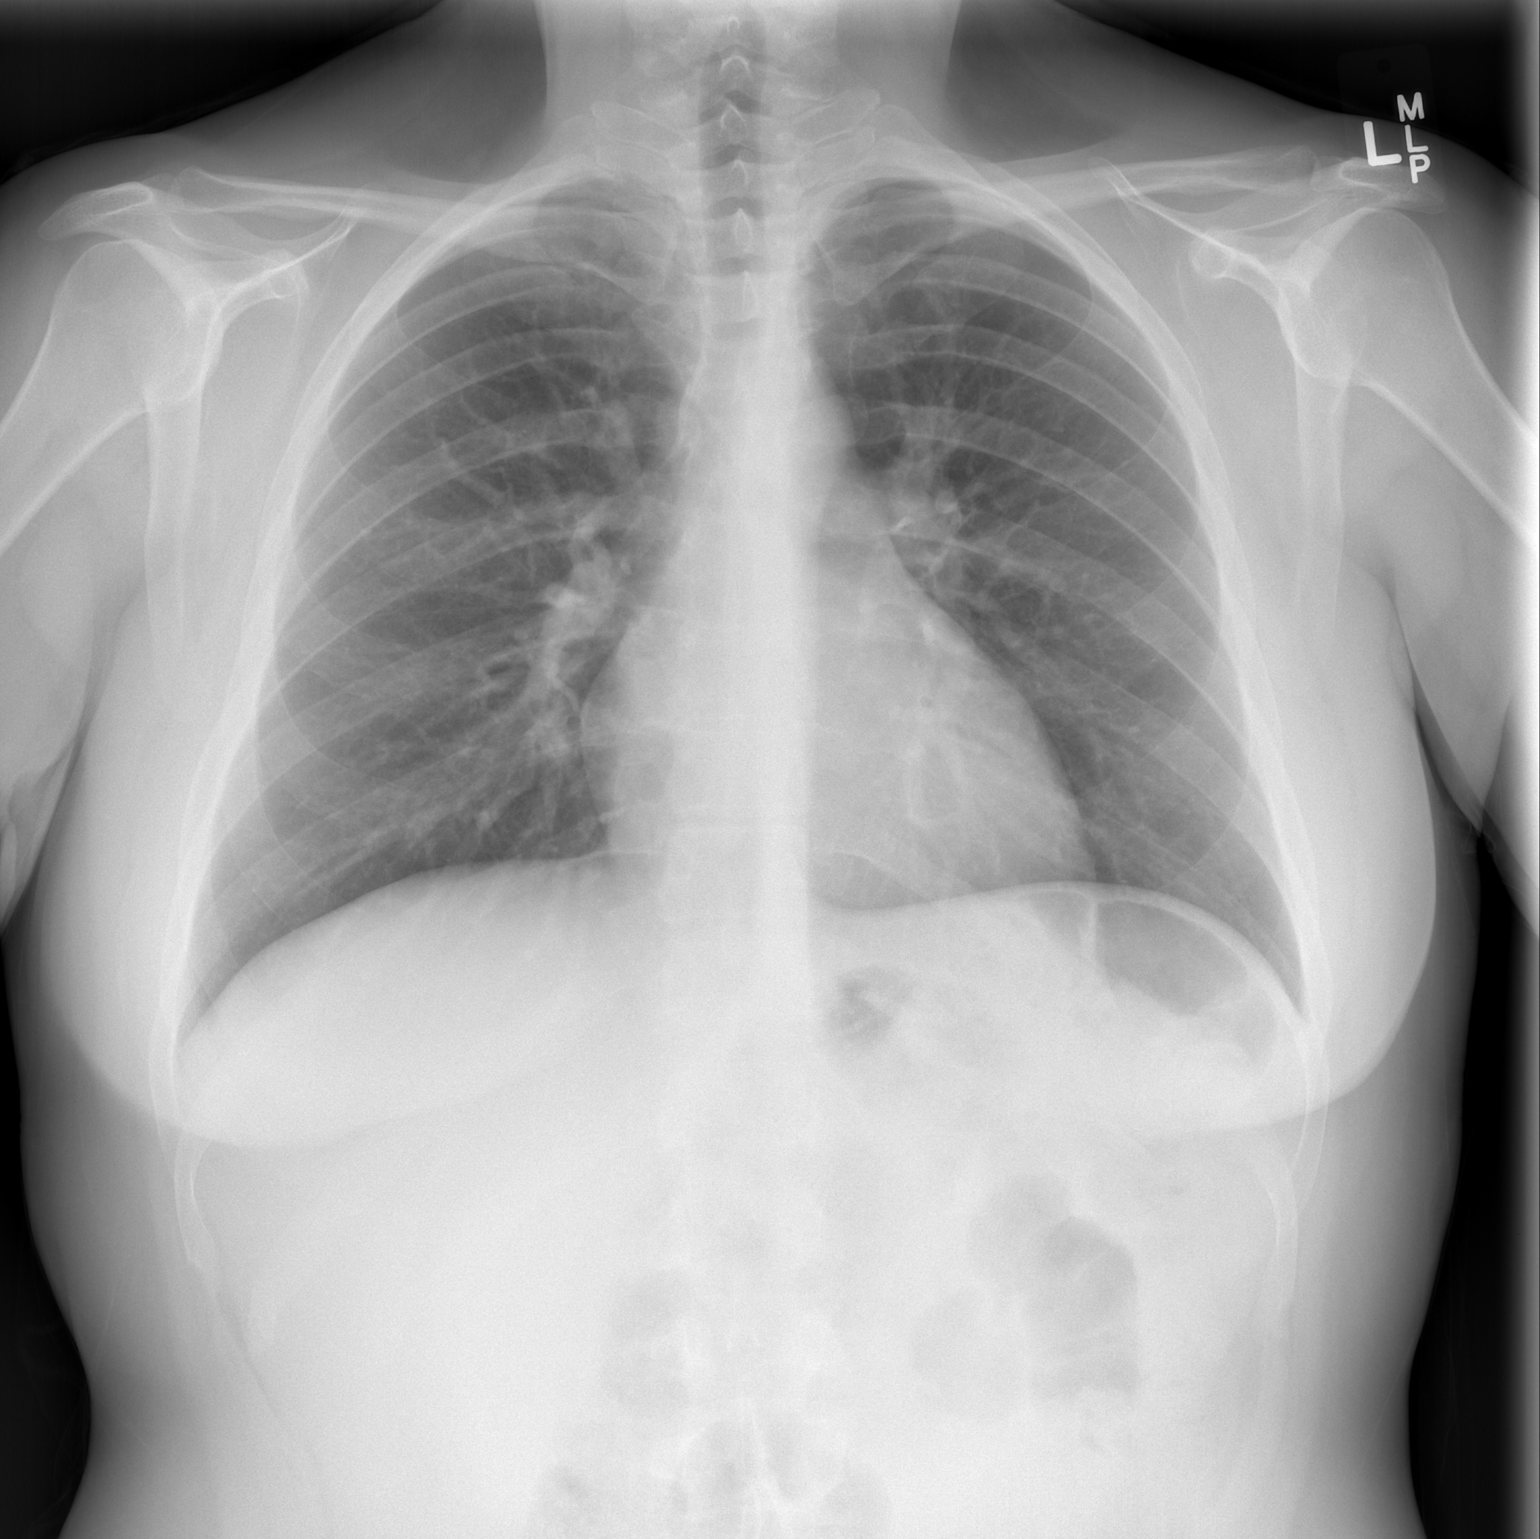

[w chest lat]
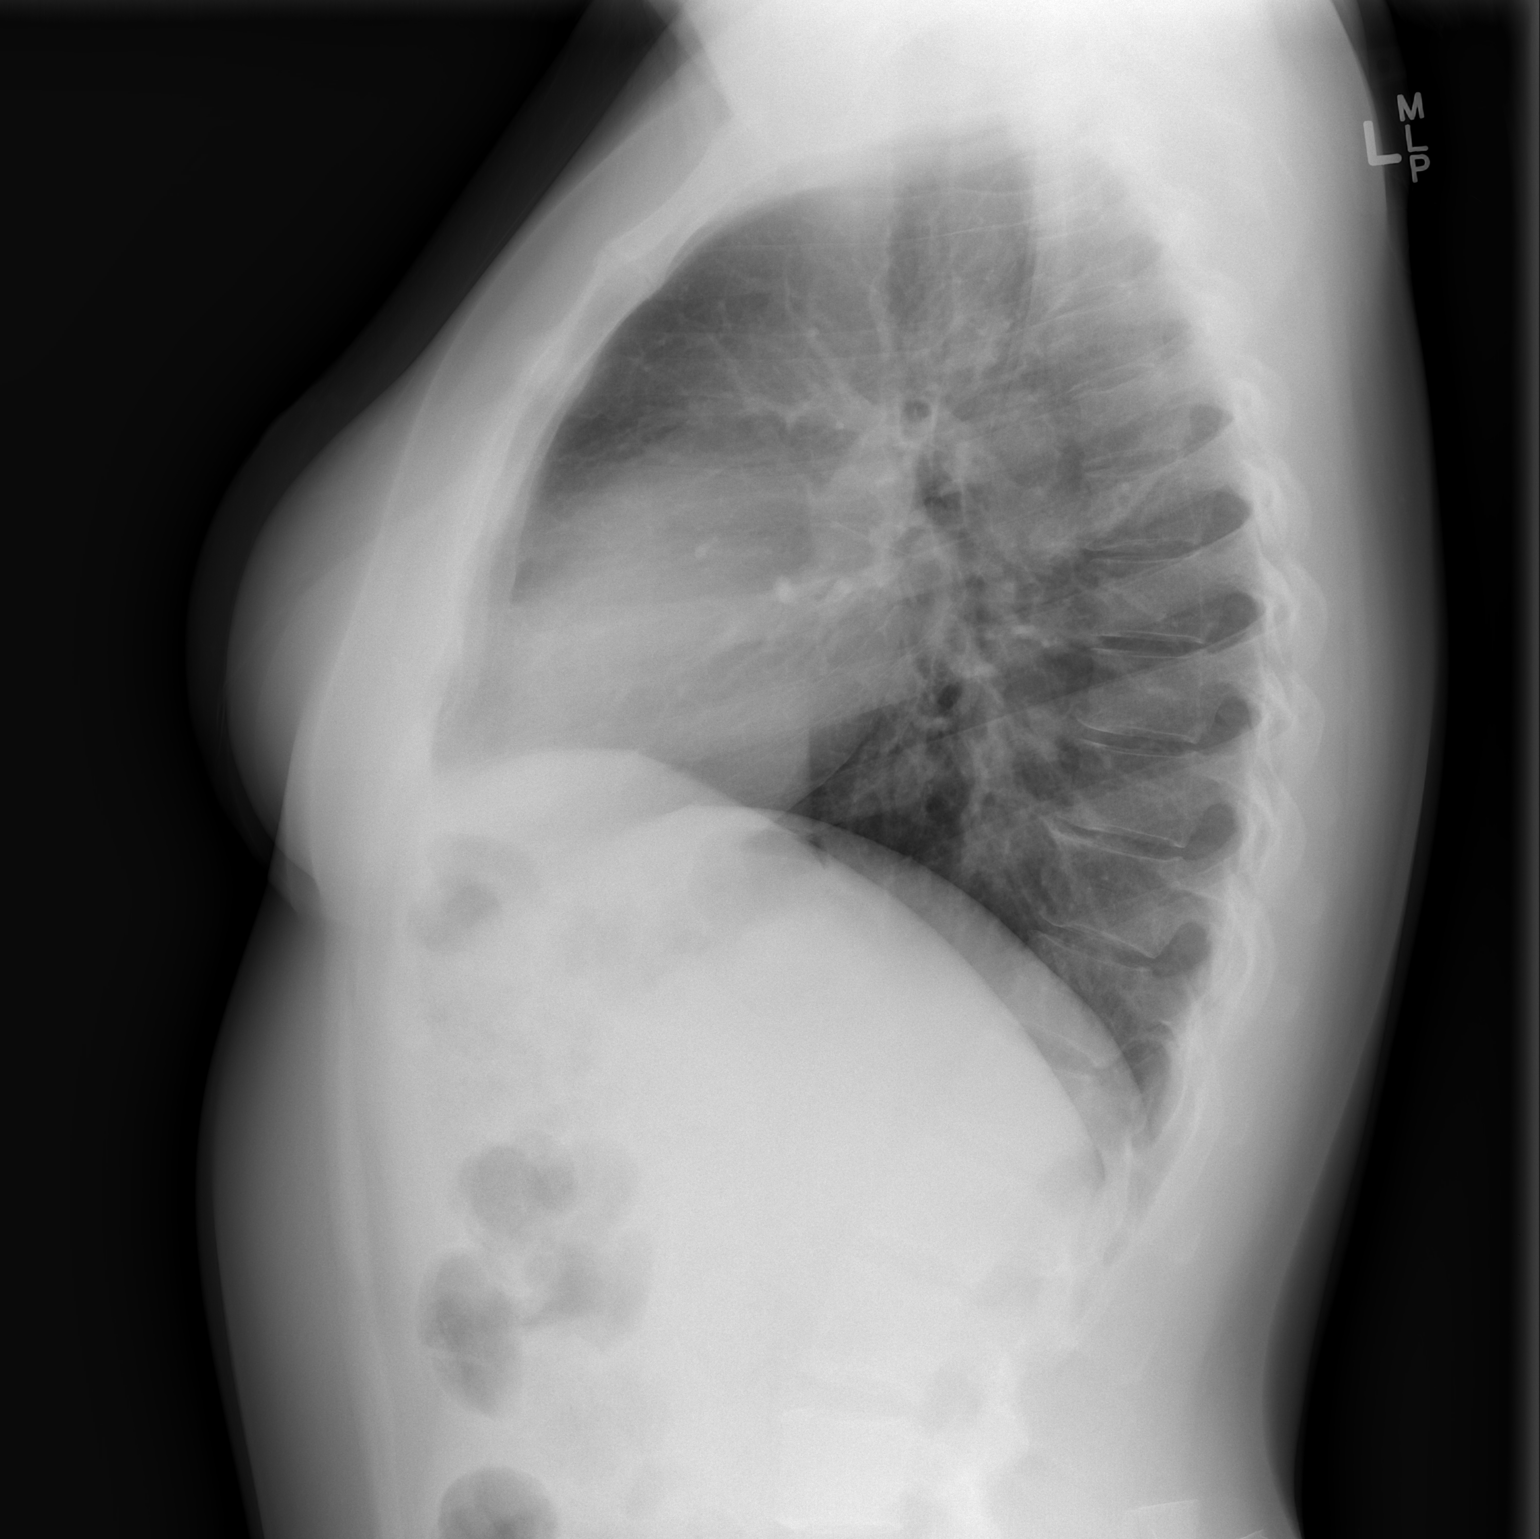

[2 of 2 positions shown; findings below may reference images not displayed]

FINDINGS: The heart size and mediastinal contours are within normal limits.
Both lungs are clear. The visualized skeletal structures are
unremarkable.
IMPRESSION: No active cardiopulmonary disease.

## 2019-08-26 MED FILL — LEVOTHYROXINE 175 MCG TAB: 175 | 30 days supply | Qty: 30 | Fill #4

## 2019-09-25 ENCOUNTER — Ambulatory Visit (INDEPENDENT_AMBULATORY_CARE_PROVIDER_SITE_OTHER): Payer: No Typology Code available for payment source | Admitting: Endocrinology

## 2019-09-25 ENCOUNTER — Encounter: Payer: Self-pay | Admitting: Endocrinology

## 2019-09-25 ENCOUNTER — Other Ambulatory Visit: Payer: Self-pay

## 2019-09-25 DIAGNOSIS — E890 Postprocedural hypothyroidism: Secondary | ICD-10-CM

## 2019-09-25 DIAGNOSIS — D440 Neoplasm of uncertain behavior of thyroid gland: Secondary | ICD-10-CM | POA: Diagnosis not present

## 2019-09-25 NOTE — Patient Instructions (Signed)
Blood tests are requested for you today.  We'll let you know about the results.  Please come back for a follow-up appointment in 6 months.   

## 2019-09-25 NOTE — Progress Notes (Signed)
Subjective:    Patient ID: Suzanne Green, female    DOB: 20-Jun-1976, 44 y.o.   MRN: [id number]  HPI  telehealth visit today via doxy video visit.  Alternatives to telehealth are presented to this patient, and the patient agrees to the telehealth visit.   Pt is advised of the cost of the visit, and agrees to this, also.   Patient is at home, and I am at the office.   Persons attending the telehealth visit: the patient and I Pt returns for f/u of stage 1 papillary thyroid cancer, with this chronology:  11/18: thyroidectomy: Pathologyshowed MULTIFOCAL FOLLICULAR VARIANT OF PAPILLARY CARCINOMA, LARGEST FOCUS 2.5 CM, LIMITED TO THYROID GLAND. 2/19: RAI, 30 mCi, at pt's request.   2/19: post-therapy scan: Small amount of residual tracer accumulation within the cervical region consistent with thyroid remnant.   2/20: TG is undetectable (Ab neg) 8/20: TG is undetectable (Ab neg)  Since the synthroid was adjusted, she feels no different.   Past Medical History:  Diagnosis Date  . Anemia 2017  . GERD (gastroesophageal reflux disease)   . Hernia, hiatal 2017  . Migraines    since 1998  . Plantar fasciitis     Past Surgical History:  Procedure Laterality Date  . abdominal cyst removed  04/2002  . BREAST IMPLANT EXCHANGE  2016  . BREATH TEK H PYLORI  09/18/2011   Procedure: BREATH TEK H PYLORI;  Surgeon: Shann Medal, MD;  Location: [facility];  Service: General;  Laterality: N/A;  . CESAREAN SECTION  12/26/07, 10/31/04  . GASTRIC ROUX-EN-Y  12/19/2011   Procedure: LAPAROSCOPIC ROUX-EN-Y GASTRIC;  Surgeon: Shann Medal, MD;  Location: [facility];  Service: General;  Laterality: N/A;  . THYROIDECTOMY N/A 06/21/2017   Procedure: TOTAL THYROIDECTOMY WITH LIMITED LYMPH NODE DISSECTION;  Surgeon: Armandina Gemma, MD;  Location: [facility];  Service: General;  Laterality: N/A;  . TUBAL LIGATION  12/2007    Social History   Socioeconomic History  . Marital status: Legally Separated    Spouse name:  Not on file  . Number of children: Not on file  . Years of education: Not on file  . Highest education level: Not on file  Occupational History  . Not on file  Tobacco Use  . Smoking status: Never Smoker  . Smokeless tobacco: Never Used  Substance and Sexual Activity  . Alcohol use: No  . Drug use: No  . Sexual activity: Never  Other Topics Concern  . Not on file  Social History Narrative  . Not on file   Social Determinants of Health   Financial Resource Strain:   . Difficulty of Paying Living Expenses: Not on file  Food Insecurity:   . Worried About Charity fundraiser in the Last Year: Not on file  . Ran Out of Food in the Last Year: Not on file  Transportation Needs:   . Lack of Transportation (Medical): Not on file  . Lack of Transportation (Non-Medical): Not on file  Physical Activity:   . Days of Exercise per Week: Not on file  . Minutes of Exercise per Session: Not on file  Stress:   . Feeling of Stress : Not on file  Social Connections:   . Frequency of Communication with Friends and Family: Not on file  . Frequency of Social Gatherings with Friends and Family: Not on file  . Attends Religious Services: Not on file  . Active Member of Clubs or Organizations: Not on  file  . Attends Archivist Meetings: Not on file  . Marital Status: Not on file  Intimate Partner Violence:   . Fear of Current or Ex-Partner: Not on file  . Emotionally Abused: Not on file  . Physically Abused: Not on file  . Sexually Abused: Not on file    Current Outpatient Medications on File Prior to Visit  Medication Sig Dispense Refill  . EPINEPHrine (EPI-PEN) 0.3 mg/0.3 mL DEVI Inject 0.3 mg into the muscle once.     . ferrous sulfate 325 (65 FE) MG tablet Take 325 mg by mouth daily with breakfast.    . ibuprofen (ADVIL,MOTRIN) 600 MG tablet Take 1 tablet (600 mg total) by mouth every 6 (six) hours as needed. (Patient taking differently: Take 600 mg by mouth every 6 (six) hours  as needed for headache or moderate pain. ) 30 tablet 0  . levothyroxine (SYNTHROID) 175 MCG tablet Take 1 tablet (175 mcg total) by mouth daily before breakfast. 30 tablet 5  . Multiple Vitamin (MULTIVITAMIN WITH MINERALS) TABS tablet Take 1 tablet by mouth daily.    . rizatriptan (MAXALT) 10 MG tablet Take 10 mg by mouth as needed for migraine. May repeat in 2 hours if needed     No current facility-administered medications on file prior to visit.    Allergies  Allergen Reactions  . Bee Venom Anaphylaxis    No family history on file.  There were no vitals taken for this visit.   Review of Systems Denies neck swelling or pain.    Objective:   Physical Exam       Assessment & Plan:  PTC, due for recheck Postsurgical hypothyroidism, due for recheck.   Patient Instructions  Blood tests are requested for you today.  We'll let you know about the results.  Please come back for a follow-up appointment in 6 months

## 2019-09-30 ENCOUNTER — Other Ambulatory Visit: Payer: Self-pay

## 2019-09-30 ENCOUNTER — Other Ambulatory Visit (INDEPENDENT_AMBULATORY_CARE_PROVIDER_SITE_OTHER): Payer: No Typology Code available for payment source

## 2019-09-30 DIAGNOSIS — E890 Postprocedural hypothyroidism: Secondary | ICD-10-CM

## 2019-09-30 DIAGNOSIS — D440 Neoplasm of uncertain behavior of thyroid gland: Secondary | ICD-10-CM | POA: Diagnosis not present

## 2019-09-30 LAB — T4, FREE: Free T4: 0.67 ng/dL (ref 0.60–1.60)

## 2019-09-30 LAB — TSH: TSH: 8.93 u[IU]/mL — ABNORMAL HIGH (ref 0.35–4.50)

## 2019-10-01 ENCOUNTER — Other Ambulatory Visit: Payer: Self-pay | Admitting: Endocrinology

## 2019-10-01 LAB — THYROGLOBULIN ANTIBODY: Thyroglobulin Ab: <1 IU/mL (ref <=1)

## 2019-10-01 LAB — THYROGLOBULIN LEVEL: Thyroglobulin: <0.1 ng/mL — ABNORMAL LOW

## 2019-10-01 MED ORDER — LEVOTHYROXINE SODIUM 200 MCG PO TABS: 200.0000 ug | ORAL_TABLET | Freq: Every day | ORAL | 3 refills | Status: DC

## 2019-10-02 MED FILL — LEVOTHYROXINE SODIUM 200 MC: 200 | 90 days supply | Qty: 90 | Fill #0

## 2020-01-30 ENCOUNTER — Other Ambulatory Visit: Payer: Self-pay

## 2020-01-30 ENCOUNTER — Encounter (HOSPITAL_COMMUNITY): Payer: Self-pay | Admitting: Emergency Medicine

## 2020-01-30 ENCOUNTER — Emergency Department (HOSPITAL_COMMUNITY): Payer: No Typology Code available for payment source

## 2020-01-30 ENCOUNTER — Emergency Department (HOSPITAL_COMMUNITY)
Admission: EM | Admit: 2020-01-30 | Discharge: 2020-01-30 | Disposition: A | Discharge status: Home / Self Care | Payer: No Typology Code available for payment source | Attending: Emergency Medicine | Admitting: Emergency Medicine

## 2020-01-30 DIAGNOSIS — Z791 Long term (current) use of non-steroidal anti-inflammatories (NSAID): Secondary | ICD-10-CM | POA: Diagnosis not present

## 2020-01-30 DIAGNOSIS — Y999 Unspecified external cause status: Secondary | ICD-10-CM | POA: Insufficient documentation

## 2020-01-30 DIAGNOSIS — Y9389 Activity, other specified: Secondary | ICD-10-CM | POA: Insufficient documentation

## 2020-01-30 DIAGNOSIS — S299XXA Unspecified injury of thorax, initial encounter: Secondary | ICD-10-CM | POA: Insufficient documentation

## 2020-01-30 DIAGNOSIS — Z79899 Other long term (current) drug therapy: Secondary | ICD-10-CM | POA: Insufficient documentation

## 2020-01-30 DIAGNOSIS — W01198A Fall on same level from slipping, tripping and stumbling with subsequent striking against other object, initial encounter: Secondary | ICD-10-CM | POA: Insufficient documentation

## 2020-01-30 DIAGNOSIS — S298XXA Other specified injuries of thorax, initial encounter: Secondary | ICD-10-CM

## 2020-01-30 DIAGNOSIS — R0789 Other chest pain: Secondary | ICD-10-CM | POA: Diagnosis present

## 2020-01-30 DIAGNOSIS — S2232XA Fracture of one rib, left side, initial encounter for closed fracture: Secondary | ICD-10-CM

## 2020-01-30 DIAGNOSIS — S2231XA Fracture of one rib, right side, initial encounter for closed fracture: Secondary | ICD-10-CM | POA: Diagnosis not present

## 2020-01-30 DIAGNOSIS — E039 Hypothyroidism, unspecified: Secondary | ICD-10-CM | POA: Diagnosis not present

## 2020-01-30 DIAGNOSIS — Y9289 Other specified places as the place of occurrence of the external cause: Secondary | ICD-10-CM | POA: Insufficient documentation

## 2020-01-30 HISTORY — DX: Malignant (primary) neoplasm, unspecified: C80.1

## 2020-01-30 MED ORDER — OXYCODONE-ACETAMINOPHEN 5-325 MG PO TABS: 1.0000 | ORAL_TABLET | ORAL | 0 refills | Status: DC | PRN

## 2020-01-30 MED ORDER — OXYCODONE-ACETAMINOPHEN 5-325 MG PO TABS
2.0000 | ORAL_TABLET | Freq: Once | ORAL | Status: AC
  Administered 2020-01-30: 2 via ORAL
  Filled 2020-01-30: qty 2

## 2020-01-30 NOTE — ED Triage Notes (Signed)
Patient here from home reporting left sided rib cage pain after "falling through the attic while cleaning". Pain 10/10. Pain with moving and coughing.

## 2020-01-30 NOTE — Discharge Instructions (Signed)
You were seen in the emergency department for evaluation of injuries to your chest after a fall.  Your chest x-ray showed a rib fracture on the left side, no pneumothorax.  Please use ice to the affected area and ibuprofen 3 times a day.  We are prescribing you oxycodone for pain, may be sedating may cause constipation.  Please use incentive spirometer.  Follow-up with your doctor and return to the emergency department if any worsening or concerning symptoms

## 2020-01-30 NOTE — ED Triage Notes (Signed)
Patient was cleaning out the attic and fell through the ceiling today. Patient c/o left rib pain. Patient states she coughed and felt a pop on the left side.

## 2020-01-30 NOTE — ED Provider Notes (Signed)
[facility] DEPT Provider Note   CSN: [id number] Arrival date & time: 01/30/20  1805     History Chief Complaint  Patient presents with  . Fall  . Rib Injury    Suzanne Green is a 44 y.o. female.  She has a history of thyroid cancer.  She said she was working in the attic cleaning when she fell through an area striking her left chest and axilla on the ceiling joist.  No loss of consciousness.  No head neck back pain.  No abdominal pain.  Left-sided chest pain is worse with twisting turning taking a deep breath.  The history is provided by the patient.  Fall This is a new problem. The current episode started 3 to 5 hours ago. The problem occurs constantly. The problem has not changed since onset.Associated symptoms include chest pain. Pertinent negatives include no abdominal pain, no headaches and no shortness of breath. The symptoms are aggravated by bending, twisting, coughing and sneezing. Nothing relieves the symptoms. She has tried a cold compress for the symptoms. The treatment provided no relief.       Past Medical History:  Diagnosis Date  . Anemia 2017  . Cancer ([place])   . GERD (gastroesophageal reflux disease)   . Hernia, hiatal 2017  . Migraines    since 1998  . Plantar fasciitis     Patient Active Problem List   Diagnosis Date Noted  . Hypocalcemia 03/18/2018  . Hypothyroidism 08/14/2017  . Neoplasm of uncertain behavior of thyroid gland 06/18/2017  . History of gastric bypass, 12/19/2011. 01/12/2012  . Morbid obesity, Weight 279, BMI - 47.2. 08/24/2011    Past Surgical History:  Procedure Laterality Date  . abdominal cyst removed  04/2002  . BREAST IMPLANT EXCHANGE  2016  . BREATH TEK H PYLORI  09/18/2011   Procedure: BREATH TEK H PYLORI;  Surgeon: Shann Medal, MD;  Location: [facility];  Service: General;  Laterality: N/A;  . CESAREAN SECTION  12/26/07, 10/31/04  . GASTRIC ROUX-EN-Y  12/19/2011   Procedure: LAPAROSCOPIC  ROUX-EN-Y GASTRIC;  Surgeon: Shann Medal, MD;  Location: [facility];  Service: General;  Laterality: N/A;  . THYROIDECTOMY N/A 06/21/2017   Procedure: TOTAL THYROIDECTOMY WITH LIMITED LYMPH NODE DISSECTION;  Surgeon: Armandina Gemma, MD;  Location: [facility];  Service: General;  Laterality: N/A;  . TUBAL LIGATION  12/2007     OB History   No obstetric history on file.     History reviewed. No pertinent family history.  Social History   Tobacco Use  . Smoking status: Never Smoker  . Smokeless tobacco: Never Used  Vaping Use  . Vaping Use: Never used  Substance Use Topics  . Alcohol use: No  . Drug use: No    Home Medications Prior to Admission medications   Medication Sig Start Date End Date Taking? Authorizing Provider  EPINEPHrine (EPI-PEN) 0.3 mg/0.3 mL DEVI Inject 0.3 mg into the muscle once.     [provider]  ferrous sulfate 325 (65 FE) MG tablet Take 325 mg by mouth daily with breakfast.    [provider]  ibuprofen (ADVIL,MOTRIN) 600 MG tablet Take 1 tablet (600 mg total) by mouth every 6 (six) hours as needed. Patient taking differently: Take 600 mg by mouth every 6 (six) hours as needed for headache or moderate pain.  06/15/16   Antonietta Breach, PA-C  levothyroxine (SYNTHROID) 200 MCG tablet Take 1 tablet (200 mcg total) by mouth daily before  breakfast. 10/01/19   Renato Shin, MD  Multiple Vitamin (MULTIVITAMIN WITH MINERALS) TABS tablet Take 1 tablet by mouth daily.    [provider]  rizatriptan (MAXALT) 10 MG tablet Take 10 mg by mouth as needed for migraine. May repeat in 2 hours if needed    [provider]    Allergies    Bee venom  Review of Systems   Review of Systems  Constitutional: Negative for fever.  HENT: Negative for sore throat.   Eyes: Negative for visual disturbance.  Respiratory: Negative for shortness of breath.   Cardiovascular: Positive for chest pain.  Gastrointestinal: Negative for abdominal pain.    Genitourinary: Negative for dysuria.  Musculoskeletal: Negative for back pain.  Skin: Negative for rash.  Neurological: Negative for headaches.    Physical Exam Updated Vital Signs BP (!) 122/95 (BP Location: Right Arm)   Pulse 82   Temp 97.7 F (36.5 C) (Oral)   Resp (!) 22   Ht 5\' 4"  (1.626 m)   Wt 81.6 kg   SpO2 99%   BMI 30.90 kg/m   Physical Exam Vitals and nursing note reviewed.  Constitutional:      General: She is not in acute distress.    Appearance: She is well-developed.  HENT:     Head: Normocephalic and atraumatic.  Eyes:     Conjunctiva/sclera: Conjunctivae normal.  Cardiovascular:     Rate and Rhythm: Normal rate and regular rhythm.     Heart sounds: No murmur heard.   Pulmonary:     Effort: Pulmonary effort is normal. No respiratory distress.     Breath sounds: Normal breath sounds.  Chest:     Chest wall: Tenderness present. No crepitus.    Abdominal:     Palpations: Abdomen is soft.     Tenderness: There is no abdominal tenderness.  Musculoskeletal:     Cervical back: Neck supple.  Skin:    General: Skin is warm and dry.  Neurological:     Mental Status: She is alert.     ED Results / Procedures / Treatments   Labs (all labs ordered are listed, but only abnormal results are displayed) Labs Reviewed - No data to display  EKG EKG Interpretation  Date/Time:  Friday January 30 2020 18:14:28 EDT Ventricular Rate:  74 PR Interval:    QRS Duration: 93 QT Interval:  392 QTC Calculation: 435 R Axis:   66 Text Interpretation: Sinus rhythm Low voltage, precordial leads 12 Lead; Mason-Likar No significant change since prior 11/18 Confirmed by Aletta Edouard [phone]) on 01/30/2020 6:47:46 PM   Radiology DG Ribs Unilateral W/Chest Left  Result Date: 01/30/2020 CLINICAL DATA:  Left chest pain after falling through attic floor. EXAM: LEFT RIBS AND CHEST - 3+ VIEW COMPARISON:  Chest radiographs 06/19/2017. FINDINGS: There is an acute mildly  displaced fracture of the left 6th rib anterolaterally, best seen on the oblique view. No other definite acute fractures are seen. Mild atelectasis is present at the left lung base. There is no pneumothorax or significant pleural effusion. The heart size and mediastinal contours are stable. There are postsurgical changes in the lower neck from previous thyroid surgery. IMPRESSION: Acute mildly displaced fracture of the left 6th rib anterolaterally. No pneumothorax or significant pleural effusion. Electronically Signed   By: Richardean Sale M.D.   On: 01/30/2020 18:57    Procedures Procedures (including critical care time)  Medications Ordered in ED Medications  oxyCODONE-acetaminophen (PERCOCET/ROXICET) 5-325 MG per tablet 2 tablet (has  no administration in time range)    ED Course  I have reviewed the triage vital signs and the nursing notes.  Pertinent labs & imaging results that were available during my care of the patient were reviewed by me and considered in my medical decision making (see chart for details).    MDM Rules/Calculators/A&P                         Patient is here for chest wall trauma after falling through the ceiling and striking her left chest axilla on some structure.  No loss consciousness.  Tender left lateral lower rib.  Chest x-ray showing no pneumothorax, does show a rib fracture.  She denies any other injury does not want any further imaging.  Will put her on some oral narcotics.  She works for [employer] so will need a note for work for this next week.  Recommended that she follow-up with her doctor/employee health and to return to the emergency room if any worsening or concerning symptoms.  Final Clinical Impression(s) / ED Diagnoses Final diagnoses:  Blunt trauma to chest, initial encounter  Closed fracture of one rib of left side, initial encounter    Rx / DC Orders ED Discharge Orders         Ordered    oxyCODONE-acetaminophen (PERCOCET/ROXICET) 5-325 MG  tablet  Every 4 hours PRN     Discontinue  Reprint     01/30/20 1938           Hayden Rasmussen, MD 01/31/20 1126

## 2020-02-02 MED FILL — EPINEPHRINE 0.3 MG AUTO-INJ: 0.3 | 30 days supply | Qty: 2 | Fill #0

## 2020-02-02 MED FILL — OXYCODONE-APAP 5-325MG: 5-325 | 2 days supply | Qty: 20 | Fill #0

## 2020-03-24 ENCOUNTER — Ambulatory Visit: Payer: No Typology Code available for payment source | Admitting: Endocrinology

## 2020-04-20 MED FILL — LEVOTHYROXINE SODIUM 200 MC: 200 | 90 days supply | Qty: 90 | Fill #2

## 2020-07-13 MED FILL — VITAMIN D3 50,000 UNITS CAP: 1.25 MG | 84 days supply | Qty: 12 | Fill #0

## 2020-07-28 MED FILL — LEVOTHYROXINE SODIUM 200 MC: 200 | 90 days supply | Qty: 90 | Fill #3

## 2020-08-07 HISTORY — PX: BREAST IMPLANT EXCHANGE: SHX6296

## 2020-08-25 ENCOUNTER — Other Ambulatory Visit (HOSPITAL_COMMUNITY): Payer: Self-pay | Admitting: Family Medicine

## 2020-08-26 MED FILL — AMOXICILLIN 875 MG TABS: 875 | 10 days supply | Qty: 20 | Fill #0

## 2020-10-29 ENCOUNTER — Other Ambulatory Visit: Payer: Self-pay

## 2020-10-29 ENCOUNTER — Other Ambulatory Visit (INDEPENDENT_AMBULATORY_CARE_PROVIDER_SITE_OTHER): Payer: No Typology Code available for payment source

## 2020-10-29 ENCOUNTER — Other Ambulatory Visit (HOSPITAL_COMMUNITY): Payer: Self-pay | Admitting: Family

## 2020-10-29 ENCOUNTER — Ambulatory Visit: Payer: No Typology Code available for payment source | Admitting: Endocrinology

## 2020-10-29 DIAGNOSIS — E890 Postprocedural hypothyroidism: Secondary | ICD-10-CM

## 2020-10-29 DIAGNOSIS — D440 Neoplasm of uncertain behavior of thyroid gland: Secondary | ICD-10-CM

## 2020-10-29 LAB — TSH: TSH: 0.05 u[IU]/mL — ABNORMAL LOW (ref 0.35–4.50)

## 2020-10-29 LAB — VITAMIN D 25 HYDROXY (VIT D DEFICIENCY, FRACTURES): VITD: 28.22 ng/mL — ABNORMAL LOW (ref 30.00–100.00)

## 2020-10-29 LAB — T4, FREE: Free T4: 1.16 ng/dL (ref 0.60–1.60)

## 2020-10-29 MED FILL — EPINEPHRINE 0.3 MG AUTO-INJ: 0.3 | 30 days supply | Qty: 2 | Fill #0

## 2020-10-29 NOTE — Progress Notes (Signed)
Subjective:    Patient ID: Suzanne Green, female    DOB: October 24, 1975, 45 y.o.   MRN: [id number]  HPI Pt returns for f/u of stage 1 papillary thyroid cancer, with this chronology:  11/18: thyroidectomy: Pathologyshowed MULTIFOCAL FOLLICULAR VARIANT OF PAPILLARY CARCINOMA, LARGEST FOCUS 2.5 CM, LIMITED TO THYROID GLAND. 2/19: RAI, 30 mCi, at pt's request.   2/19: post-therapy scan: Small amount of residual tracer accumulation within the cervical region consistent with thyroid remnant.   2/20: TG is undetectable (Ab neg) 8/20: TG is undetectable (Ab neg)  2/21 TG is undetectable (Ab neg)  Denies neck swelling.  Pt says she feels better on the higher dosage of synthroid.  Past Medical History:  Diagnosis Date  . Anemia 2017  . Cancer ([place])   . GERD (gastroesophageal reflux disease)   . Hernia, hiatal 2017  . Migraines    since 1998  . Plantar fasciitis     Past Surgical History:  Procedure Laterality Date  . abdominal cyst removed  04/2002  . BREAST IMPLANT EXCHANGE  2016  . BREATH TEK H PYLORI  09/18/2011   Procedure: BREATH TEK H PYLORI;  Surgeon: Shann Medal, MD;  Location: [facility];  Service: General;  Laterality: N/A;  . CESAREAN SECTION  12/26/07, 10/31/04  . GASTRIC ROUX-EN-Y  12/19/2011   Procedure: LAPAROSCOPIC ROUX-EN-Y GASTRIC;  Surgeon: Shann Medal, MD;  Location: [facility];  Service: General;  Laterality: N/A;  . THYROIDECTOMY N/A 06/21/2017   Procedure: TOTAL THYROIDECTOMY WITH LIMITED LYMPH NODE DISSECTION;  Surgeon: Armandina Gemma, MD;  Location: [facility];  Service: General;  Laterality: N/A;  . TUBAL LIGATION  12/2007    Social History   Socioeconomic History  . Marital status: Legally Separated    Spouse name: Not on file  . Number of children: Not on file  . Years of education: Not on file  . Highest education level: Not on file  Occupational History  . Not on file  Tobacco Use  . Smoking status: Never Smoker  . Smokeless tobacco: Never Used  Vaping  Use  . Vaping Use: Never used  Substance and Sexual Activity  . Alcohol use: No  . Drug use: No  . Sexual activity: Never  Other Topics Concern  . Not on file  Social History Narrative  . Not on file   Social Determinants of Health   Financial Resource Strain: Not on file  Food Insecurity: Not on file  Transportation Needs: Not on file  Physical Activity: Not on file  Stress: Not on file  Social Connections: Not on file  Intimate Partner Violence: Not on file    Current Outpatient Medications on File Prior to Visit  Medication Sig Dispense Refill  . EPINEPHrine (EPI-PEN) 0.3 mg/0.3 mL DEVI Inject 0.3 mg into the muscle once.     . ferrous sulfate 325 (65 FE) MG tablet Take 325 mg by mouth daily with breakfast.    . ibuprofen (ADVIL,MOTRIN) 600 MG tablet Take 1 tablet (600 mg total) by mouth every 6 (six) hours as needed. (Patient taking differently: Take 600 mg by mouth every 6 (six) hours as needed for headache or moderate pain.) 30 tablet 0  . levothyroxine (SYNTHROID) 200 MCG tablet Take 1 tablet (200 mcg total) by mouth daily before breakfast. 90 tablet 3  . Multiple Vitamin (MULTIVITAMIN WITH MINERALS) TABS tablet Take 1 tablet by mouth daily.    Marland Kitchen oxyCODONE-acetaminophen (PERCOCET/ROXICET) 5-325 MG tablet Take 1-2 tablets by mouth every 4 (  four) hours as needed for severe pain. 15 tablet 0  . rizatriptan (MAXALT) 10 MG tablet Take 10 mg by mouth as needed for migraine. May repeat in 2 hours if needed     No current facility-administered medications on file prior to visit.    Allergies  Allergen Reactions  . Bee Venom Anaphylaxis    No family history on file.  BP 120/78 (BP Location: Right Arm, Patient Position: Sitting, Cuff Size: Large)   Pulse 68   Ht 5' 4.5" (1.638 m)   Wt 193 lb (87.5 kg)   SpO2 98%   BMI 32.62 kg/m    Review of Systems     Objective:   Physical Exam VITAL SIGNS:  See vs page GENERAL: no distress Neck: a healed scar is present.  I  do not appreciate a nodule in the thyroid or elsewhere in the neck.    Lab Results  Component Value Date   TSH 0.05 (L) 10/29/2020  25-OH Vit-D=28    Assessment & Plan:  Vit-D def: uncontrolled.  I advised 1000 units/d Hypothyroidism: overreplaced.  However, as TSH has varied widely with little or no dosage change, I advised pt to continue the same synthroid.  PTC: recheck today

## 2020-10-29 NOTE — Patient Instructions (Addendum)
Blood tests are requested for you today.  We'll let you know about the results.  Please come back for a follow-up appointment in 1 year.    

## 2020-11-01 LAB — PTH, INTACT AND CALCIUM
Calcium: 9.1 mg/dL (ref 8.6–10.2)
PTH: 35 pg/mL (ref 16–77)

## 2020-11-01 LAB — THYROGLOBULIN LEVEL: Thyroglobulin: 0.1 ng/mL — ABNORMAL LOW

## 2020-11-01 LAB — THYROGLOBULIN ANTIBODY: Thyroglobulin Ab: <1 IU/mL (ref <=1)

## 2020-11-22 ENCOUNTER — Encounter: Payer: Self-pay | Admitting: Endocrinology

## 2020-11-22 ENCOUNTER — Other Ambulatory Visit: Payer: Self-pay

## 2020-11-22 MED ORDER — LEVOTHYROXINE SODIUM 200 MCG PO TABS
200.0000 ug | ORAL_TABLET | Freq: Every day | ORAL | 3 refills | Status: DC
  Filled 2020-11-22: qty 90, 90d supply, fill #0
  Filled 2021-03-14: qty 90, 90d supply, fill #1
  Filled 2021-07-04: qty 90, 90d supply, fill #2
  Filled 2021-10-11: qty 90, 90d supply, fill #3

## 2020-11-23 ENCOUNTER — Other Ambulatory Visit (HOSPITAL_COMMUNITY): Payer: Self-pay

## 2020-11-23 MED FILL — Rimegepant Sulfate Tab Disint 75 MG: ORAL | 30 days supply | Qty: 16 | Fill #0 | Status: AC

## 2020-11-24 ENCOUNTER — Other Ambulatory Visit (HOSPITAL_COMMUNITY): Payer: Self-pay

## 2021-02-03 ENCOUNTER — Other Ambulatory Visit (HOSPITAL_COMMUNITY): Payer: Self-pay

## 2021-02-03 MED ORDER — NURTEC 75 MG PO TBDP
ORAL_TABLET | ORAL | 0 refills | Status: DC
  Filled 2021-02-03: qty 16, 32d supply, fill #0

## 2021-02-04 ENCOUNTER — Other Ambulatory Visit (HOSPITAL_COMMUNITY): Payer: Self-pay

## 2021-02-15 ENCOUNTER — Other Ambulatory Visit (HOSPITAL_COMMUNITY): Payer: Self-pay

## 2021-02-17 ENCOUNTER — Other Ambulatory Visit (HOSPITAL_COMMUNITY): Payer: Self-pay

## 2021-02-22 ENCOUNTER — Other Ambulatory Visit (HOSPITAL_COMMUNITY): Payer: Self-pay

## 2021-02-22 MED ORDER — RIMEGEPANT SULFATE 75 MG PO TBDP
ORAL_TABLET | ORAL | 0 refills | Status: AC
  Filled 2021-02-22: qty 16, 30d supply, fill #0

## 2021-02-28 ENCOUNTER — Other Ambulatory Visit (HOSPITAL_COMMUNITY): Payer: Self-pay

## 2021-03-09 ENCOUNTER — Other Ambulatory Visit (HOSPITAL_COMMUNITY): Payer: Self-pay

## 2021-03-14 ENCOUNTER — Other Ambulatory Visit (HOSPITAL_COMMUNITY): Payer: Self-pay

## 2021-03-15 ENCOUNTER — Other Ambulatory Visit (HOSPITAL_COMMUNITY): Payer: Self-pay

## 2021-05-11 ENCOUNTER — Other Ambulatory Visit (HOSPITAL_COMMUNITY): Payer: Self-pay

## 2021-05-12 ENCOUNTER — Other Ambulatory Visit (HOSPITAL_COMMUNITY): Payer: Self-pay

## 2021-05-12 MED ORDER — CEPHALEXIN 500 MG PO CAPS
ORAL_CAPSULE | ORAL | 0 refills | Status: DC
  Filled 2021-05-12: qty 20, 5d supply, fill #0

## 2021-05-12 MED ORDER — HYDROCODONE-ACETAMINOPHEN 5-325 MG PO TABS
1.0000 | ORAL_TABLET | Freq: Four times a day (QID) | ORAL | 0 refills | Status: DC
  Filled 2021-05-12: qty 25, 7d supply, fill #0

## 2021-05-12 MED ORDER — PROMETHAZINE HCL 12.5 MG PO TABS
ORAL_TABLET | ORAL | 0 refills | Status: DC
  Filled 2021-05-12: qty 10, 2d supply, fill #0

## 2021-07-05 ENCOUNTER — Other Ambulatory Visit (HOSPITAL_COMMUNITY): Payer: Self-pay

## 2021-10-12 ENCOUNTER — Other Ambulatory Visit (HOSPITAL_COMMUNITY): Payer: Self-pay

## 2021-10-14 ENCOUNTER — Other Ambulatory Visit (HOSPITAL_COMMUNITY): Payer: Self-pay

## 2021-11-04 ENCOUNTER — Ambulatory Visit: Payer: No Typology Code available for payment source | Admitting: Endocrinology

## 2021-12-29 ENCOUNTER — Other Ambulatory Visit (HOSPITAL_COMMUNITY): Payer: Self-pay

## 2021-12-29 MED ORDER — LEVOTHYROXINE SODIUM 200 MCG PO TABS
ORAL_TABLET | ORAL | 0 refills | Status: DC
  Filled 2021-12-29 – 2022-01-09 (×2): qty 90, 90d supply, fill #0

## 2021-12-29 MED ORDER — EPINEPHRINE 0.3 MG/0.3ML IJ SOAJ
INTRAMUSCULAR | 3 refills | Status: DC
  Filled 2021-12-29: qty 2, 1d supply, fill #0
  Filled 2022-01-09: qty 2, 30d supply, fill #0

## 2021-12-29 MED ORDER — RIZATRIPTAN BENZOATE 10 MG PO TABS
ORAL_TABLET | ORAL | 0 refills | Status: DC
  Filled 2021-12-29 – 2022-01-09 (×2): qty 16, 30d supply, fill #0

## 2022-01-06 ENCOUNTER — Other Ambulatory Visit (HOSPITAL_COMMUNITY): Payer: Self-pay

## 2022-01-09 ENCOUNTER — Other Ambulatory Visit (HOSPITAL_COMMUNITY): Payer: Self-pay

## 2022-03-06 ENCOUNTER — Other Ambulatory Visit (HOSPITAL_COMMUNITY): Payer: Self-pay

## 2022-03-06 MED ORDER — TRAMADOL HCL 50 MG PO TABS
ORAL_TABLET | ORAL | 1 refills | Status: DC
  Filled 2022-03-06 – 2022-05-18 (×2): qty 30, 10d supply, fill #0
  Filled 2022-07-07: qty 30, 10d supply, fill #1

## 2022-03-08 ENCOUNTER — Other Ambulatory Visit: Payer: Self-pay | Admitting: Obstetrics and Gynecology

## 2022-03-08 DIAGNOSIS — R928 Other abnormal and inconclusive findings on diagnostic imaging of breast: Secondary | ICD-10-CM

## 2022-03-16 ENCOUNTER — Other Ambulatory Visit: Payer: Self-pay | Admitting: Obstetrics and Gynecology

## 2022-03-16 ENCOUNTER — Ambulatory Visit
Admission: RE | Admit: 2022-03-16 | Discharge: 2022-03-16 | Disposition: A | Discharge status: Home / Self Care | Payer: No Typology Code available for payment source | Source: Ambulatory Visit | Attending: Obstetrics and Gynecology | Admitting: Obstetrics and Gynecology

## 2022-03-16 DIAGNOSIS — R928 Other abnormal and inconclusive findings on diagnostic imaging of breast: Secondary | ICD-10-CM

## 2022-03-16 DIAGNOSIS — N6310 Unspecified lump in the right breast, unspecified quadrant: Secondary | ICD-10-CM

## 2022-03-21 ENCOUNTER — Other Ambulatory Visit (HOSPITAL_COMMUNITY): Payer: Self-pay

## 2022-05-18 ENCOUNTER — Ambulatory Visit (INDEPENDENT_AMBULATORY_CARE_PROVIDER_SITE_OTHER): Payer: No Typology Code available for payment source | Admitting: Internal Medicine

## 2022-05-18 ENCOUNTER — Other Ambulatory Visit (HOSPITAL_COMMUNITY): Payer: Self-pay

## 2022-05-18 ENCOUNTER — Encounter: Payer: Self-pay | Admitting: Internal Medicine

## 2022-05-18 VITALS — BP 100/62 | HR 65 | Ht 64.5 in | Wt 182.0 lb

## 2022-05-18 DIAGNOSIS — E890 Postprocedural hypothyroidism: Secondary | ICD-10-CM | POA: Diagnosis not present

## 2022-05-18 DIAGNOSIS — C73 Malignant neoplasm of thyroid gland: Secondary | ICD-10-CM | POA: Diagnosis not present

## 2022-05-18 DIAGNOSIS — E559 Vitamin D deficiency, unspecified: Secondary | ICD-10-CM | POA: Diagnosis not present

## 2022-05-18 MED ORDER — LEVOTHYROXINE SODIUM 200 MCG PO TABS
200.0000 ug | ORAL_TABLET | Freq: Every day | ORAL | 1 refills | Status: DC
  Filled 2022-05-18: qty 5, 5d supply, fill #0

## 2022-05-18 MED ORDER — RIZATRIPTAN BENZOATE 10 MG PO TABS
ORAL_TABLET | ORAL | 0 refills | Status: DC
  Filled 2022-05-18: qty 16, 30d supply, fill #0

## 2022-05-18 NOTE — Patient Instructions (Signed)
Please continue Levothyroxine 200 mcg daily.  Take the thyroid hormone every day, with water, at least 30 minutes before breakfast, separated by at least 4 hours from: - acid reflux medications - calcium - iron - multivitamins  Move the Multivitamins and calcium >4h after Levothyroxine.  Stay off Biotin and let's have you back for labs in 5 days.  I ordered a neck U/S for you.  Please return in 6 months.

## 2022-05-18 NOTE — Progress Notes (Addendum)
Patient ID: Suzanne Green, female   DOB: February 28, 1976, 46 y.o.   MRN: [id number]  HPI  Suzanne Green is a 46 y.o.-year-old female, returning for follow-up for follicular variant of papillary thyroid cancer and postsurgical hypothyroidism.  She previously saw Dr. Loanne Drilling, last visit 1 year and 7 months ago.  Pt. has been dx with thyroid cancer in 2018.  She had RAI treatment with no clinical or biochemical persistent or recurrent disease.  Reviewed and addended Dr. Cordelia Pen note: 1/18: Total thyroidectomy  1. Thyroid, thyroidectomy, total - MULTIFOCAL FOLLICULAR VARIANT OF PAPILLARY CARCINOMA, LARGEST FOCUS 2.5 CM, LIMITED TO THYROID GLAND. - SEE ONCOLOGY TABLE. 2. Lymph node, biopsy, central compartment - BENIGN THYMIC TISSUE. - SMALL FRAGMENT OF BENIGN PARATHYROID TISSUE. Specimen: Thyroid gland Procedure (including lymph node sampling if applicable): Total thyroidectomy Specimen Integrity (intact/fragmented): Intact Tumor focality: Multifocal Dominant tumor: Maximum tumor size (cm): 2.5 cm Tumor laterality: Left lobe Histologic type (including subtype and/or unique features as applicable): Follicular variant of papillary carcinoma Tumor capsule: Unencapsulated Extrathyroidal extension: No Capsular invasion with degree of invasion if present: Not applicable Second and additional tumors: Yes Tumor size(s): 0.2 cm and 0.3 cm Tumor laterality: Left lobe Histologic type (including subtype and/or unique features as applicable): Follicular variant of papillary carcinoma Tumor capsule: Unencapsulated Extrathyroidal extension: No Capsular invasion with degree of invasion if present: Not applicable Margins: Negative TNM code: pT2, pNX Non-neoplastic thyroid: Small hyperplastic nodule. A small fragment of benign parathyroid tissue is also seen adjacent to a thyroid section from the right inferior lobe Comments: Sections show multifocal follicular variant of papillary carcinoma involving  left lobe, the largest of which measures 2.5 cm in maximum dimension and limited to thyroid with negative surgical margins of resection. A possible minute focus is also seen in the right lobe (block 1H). In this focus, there is a mostly follicular pattern with slight nuclear atypia in the form of slight enlargement and occasional grooves. 2. Some of the tissue shows cautery artifact and is difficult to evaluate. Where intact, there is benign appearing thymic tissue with no evidence of lymph nodal tissue. A small fragment of benign parathyroid is also present. (BNS:ecj 06/25/2017)  2/19: RAI, 30 mCi, at pt's request.   2/19: post-therapy scan: Small amount of residual tracer accumulation within the cervical region consistent with thyroid remnant.    Lab Results  Component Value Date   THYROGLB 0.1 (L) 10/29/2020   THYROGLB <0.1 (L) 09/30/2019   THYROGLB <0.1 (L) 03/25/2019   THYROGLB <0.1 (L) 09/10/2018   THYROGLB <0.1 (L) 03/18/2018   THGAB <1 10/29/2020   THGAB <1 09/30/2019   THGAB <1 03/25/2019   THGAB <1 09/10/2018   THGAB <1 03/18/2018   Pt denies: - feeling nodules in neck - hoarseness - choking  She has: - dysphagia - intermittent, not exacerbating  She has a L cervical area tenderness.  Postsurgical hypothyroidism:  She is on Levothyroxine 200 mcg, taken: - in am - fasting - at least 2h from b'fast - + calcium - 16mn-1h later - stopped iron (scheduled for a TAH for fibroids 08/2022) - + multivitamins - 416m-1h later - no PPIs - + on Biotin 5000 mcg daily - stopped 2 days ago.   I reviewed pt's thyroid tests: Lab Results  Component Value Date   TSH 0.05 (L) 10/29/2020   TSH 8.93 (H) 09/30/2019   TSH <0.01 Repeated and verified X2. (L) 03/25/2019   TSH 6.07 (H) 09/10/2018   TSH 0.20 (L) 03/18/2018  TSH 10.06 (H) 01/08/2018   TSH 28.49 (H) 11/12/2017   TSH 44.42 (H) 08/22/2017   TSH 16.17 (H) 08/14/2017   TSH 2.167 08/29/2011   FREET4 1.16 10/29/2020    FREET4 0.67 09/30/2019   FREET4 0.71 09/10/2018    Pt denies: - fatigue - weight gain - cold intolerance - constipation - dry skin - hair loss - depression  She has + FH of thyroid disorders in: M, MGM, sister - all hypothyroidism. No FH of thyroid cancer.  No h/o radiation tx to head or neck other than RAI tx.  She had postsurgical hypocalcemia, which resolved:  Lab Results  Component Value Date   PTH 35 10/29/2020   PTH 38 03/25/2019   PTH 80 (H) 09/10/2018   PTH 125 (H) 03/18/2018   Lab Results  Component Value Date   CALCIUM 9.1 10/29/2020   CALCIUM 9.7 03/25/2019   CALCIUM 8.8 09/10/2018   CALCIUM 8.7 03/18/2018   CALCIUM 9.2 06/22/2017   CALCIUM 8.7 (L) 06/14/2016   CALCIUM 9.4 03/25/2012   CALCIUM 8.6 12/30/2011   CALCIUM 8.4 12/29/2011   CALCIUM 9.3 12/28/2011   She has vitamin D deficiency: Lab Results  Component Value Date   VD25OH 28.22 (L) 10/29/2020   VD25OH 26.57 (L) 03/25/2019   VD25OH 15.23 (L) 09/10/2018   VD25OH 15.99 (L) 03/18/2018   She is on vitamin D 10,000 units daily.  She does have a history of GERD, hiatal hernia, migraine, anemia.  Also, she had gastric bypass in 2013..  ROS: + see HPI  Past Medical History:  Diagnosis Date   Anemia 2017   Cancer ([place])    GERD (gastroesophageal reflux disease)    Hernia, hiatal 2017   Migraines    since 1998   Plantar fasciitis    Past Surgical History:  Procedure Laterality Date   abdominal cyst removed  04/2002   AUGMENTATION MAMMAPLASTY     BREAST IMPLANT EXCHANGE  2016   BREATH TEK H PYLORI  09/18/2011   Procedure: BREATH TEK H PYLORI;  Surgeon: Shann Medal, MD;  Location: [facility];  Service: General;  Laterality: N/A;   CESAREAN SECTION  12/26/07, 10/31/04   GASTRIC ROUX-EN-Y  12/19/2011   Procedure: LAPAROSCOPIC ROUX-EN-Y GASTRIC;  Surgeon: Shann Medal, MD;  Location: [facility];  Service: General;  Laterality: N/A;   THYROIDECTOMY N/A 06/21/2017   Procedure: TOTAL  THYROIDECTOMY WITH LIMITED LYMPH NODE DISSECTION;  Surgeon: Armandina Gemma, MD;  Location: [facility];  Service: General;  Laterality: N/A;   TUBAL LIGATION  12/2007   Social History   Socioeconomic History   Marital status: Widowed    Spouse name: Not on file   Number of children: Not on file   Years of education: Not on file   Highest education level: Not on file  Occupational History   Not on file  Tobacco Use   Smoking status: Never   Smokeless tobacco: Never  Vaping Use   Vaping Use: Never used  Substance and Sexual Activity   Alcohol use: No   Drug use: No   Sexual activity: Never  Other Topics Concern   Not on file  Social History Narrative   Not on file   Social Determinants of Health   Financial Resource Strain: Not on file  Food Insecurity: Not on file  Transportation Needs: Not on file  Physical Activity: Not on file  Stress: Not on file  Social Connections: Not on file  Intimate Partner Violence: Not  on file   Current Outpatient Medications on File Prior to Visit  Medication Sig Dispense Refill   cephALEXin (KEFLEX) 500 MG capsule Take 1 capsule by mouth four times a day 20 capsule 0   EPINEPHrine (EPI-PEN) 0.3 mg/0.3 mL DEVI Inject 0.3 mg into the muscle once.      EPINEPHrine (EPIPEN 2-PAK) 0.3 mg/0.3 mL IJ SOAJ injection Use as directed for allergic reactions 2 each 3   ferrous sulfate 325 (65 FE) MG tablet Take 325 mg by mouth daily with breakfast.     HYDROcodone-acetaminophen (NORCO/VICODIN) 5-325 MG tablet Take 1 tablet by mouth every 6 hours 25 tablet 0   ibuprofen (ADVIL,MOTRIN) 600 MG tablet Take 1 tablet (600 mg total) by mouth every 6 (six) hours as needed. (Patient taking differently: Take 600 mg by mouth every 6 (six) hours as needed for headache or moderate pain.) 30 tablet 0   levothyroxine (SYNTHROID) 200 MCG tablet Take 1 tablet by mouth once daily. 90 tablet 0   Multiple Vitamin (MULTIVITAMIN WITH MINERALS) TABS tablet Take 1 tablet by mouth  daily.     oxyCODONE-acetaminophen (PERCOCET/ROXICET) 5-325 MG tablet Take 1-2 tablets by mouth every 4 (four) hours as needed for severe pain. 15 tablet 0   promethazine (PHENERGAN) 12.5 MG tablet Take 1 tablet by mouth every 6 hours as needed for nausea 10 tablet 0   Rimegepant Sulfate (NURTEC) 75 MG TBDP Place 1 tablet (75 mg) on top of tongue, allow to dissolve then swallow  every other day 16 tablet 0   rizatriptan (MAXALT) 10 MG tablet Take 10 mg by mouth as needed for migraine. May repeat in 2 hours if needed     rizatriptan (MAXALT) 10 MG tablet Take 1 tablet  by mouth  once, may repeat at 2 hour intervals; do not exceed 3 tablets in 24 hours 16 tablet 0   traMADol (ULTRAM) 50 MG tablet Take 1 tablet by mouth every 8 hours as needed. 30 tablet 1   No current facility-administered medications on file prior to visit.   Allergies  Allergen Reactions   Bee Venom Anaphylaxis   No family history on file.  PE: BP 100/62 (BP Location: Left Arm, Patient Position: Sitting, Cuff Size: Normal)   Pulse 65   Ht 5' 4.5" (1.638 m)   Wt 182 lb (82.6 kg)   SpO2 99%   BMI 30.76 kg/m  Wt Readings from Last 3 Encounters:  05/18/22 182 lb (82.6 kg)  10/29/20 193 lb (87.5 kg)  01/30/20 180 lb (81.6 kg)   Constitutional: overweight, in NAD Eyes:  EOMI, no exophthalmos ENT: no neck masses palpated, + L mid cervical lymphadenopathy Cardiovascular: RRR, No MRG Respiratory: CTA B Gastrointestinal: abdomen soft, NT, ND, BS+ Musculoskeletal: no deformities Skin: moist, warm, no rashes Neurological: no tremor with outstretched hands  ASSESSMENT: 1. Thyroid cancer - see HPI  2. Postsurgical Hypothyroidism  3.  Vitamin D deficiency  PLAN:  1. Thyroid cancer -follicular variant of papillary thyroid cancer - I had a long discussion with the patient about her recent diagnosis of thyroid cancer. We reviewed together the pathology: T2NxMx, but clinically, due to age, she is stage 1 TNM.  The tumor  was multifocal and not encapsulated. - papillary thyroid cancer is a slow growing cancer with good prognosis; her life expectancy or quality of life is unlikely to be reduced due to the cancer.  - She already had RAI treatment with a whole-body scan obtained after the treatment only showing  uptake in the thyroid remnant, without distant metastasis - her thyroglobulin levels have been undetectable except for the level obtained in 10/2020, which was barely detectable, 0.1.  We discussed that this could be due to assay variability. - We will repeat her thyroglobulin and ATA antibodies in a week (she took high-dose biotin 2 days ago) - We discussed that the trend, rather than the absolute value is valuable we will follow the thyroglobulin. - I also would want to check a thyroid ultrasound since she did not have imaging after her whole-body scan in 2019.  Also, she does describe an area of sensitivity/discomfort in the mid left neck.  This area corresponds to a slightly enlarged lymph node on palpation. - I will then see the patient in approximately 4 months  2.  Patient with h/o total thyroidectomy for cancer, now with uncontrolled iatrogenic hypothyroidism, on levothyroxine therapy.  - latest thyroid labs reviewed with pt. >> normal: Lab Results  Component Value Date   TSH 0.05 (L) 10/29/2020  - she continues on LT4 200 mcg daily - we discussed about taking the thyroid hormone every day, with water, >30 minutes before breakfast, separated by >4 hours from acid reflux medications, calcium, iron, multivitamins. Pt. is not taking it correctly -she takes her calcium and multivitamins too close to levothyroxine.  We discussed about moving these later. - will check thyroid tests in 1 week: TSH and fT4 -after the results are back, we may need to reduce the dose of levothyroxine, especially now that she is separating it more from the calcium and multivitamins.  Target TSH for her would be lower target range,  not below the target range. - If labs are abnormal, she will need to return for repeat TFTs in 1.5 months  3.  Vitamin D deficiency -She is on 10,000 units vitamin D daily -We will recheck a vitamin D level at next lab draw  Orders Placed This Encounter  Procedures   US THYROID   TSH   T4, free   Thyroglobulin antibody   Thyroglobulin Level   Vitamin D, 25-hydroxy   - Total time spent for the visit: 40 min, in precharting, reviewing Dr. Cordelia Pen last note, obtaining medical information from the chart and from the pt, reviewing her  previous labs, evaluations, and treatments, reviewing her symptoms, counseling her about her endocrine conditions (please see the discussed topics above), and developing a plan to further investigate and treat them.   Neck U/S (05/22/2022): Isthmus: Surgically absent. There is no residual nodular soft tissue within the isthmic resection bed.   Right lobe: Surgically absent. There is no residual nodular soft tissue within the right lobectomy resection bed.   Left lobe: There is a fairly well-defined approximately 0.8 x 0.4 x 0.3 cm hypoechoic nodule within the left thyroidectomy resection bed. _________________________________________________________   No regional cervical lymphadenopathy.   IMPRESSION: Punctate (0.8 cm) hypoechoic nodule within the left thyroidectomy resection bed may represent a non pathologically enlarged cervical lymph node though residual/recurrent thyroid parenchyma could have a similar appearance. Correlation with thyroglobulin levels could be performed as indicated.   Will await thyroglobulin level.  If low, plan to repeat the ultrasound in a year and possibly biopsy the mass if still visible.  Philemon Kingdom, MD PhD [facility]

## 2022-05-19 ENCOUNTER — Other Ambulatory Visit (HOSPITAL_COMMUNITY): Payer: Self-pay

## 2022-05-23 ENCOUNTER — Other Ambulatory Visit (INDEPENDENT_AMBULATORY_CARE_PROVIDER_SITE_OTHER): Payer: No Typology Code available for payment source

## 2022-05-23 ENCOUNTER — Ambulatory Visit
Admission: RE | Admit: 2022-05-23 | Discharge: 2022-05-23 | Disposition: A | Discharge status: Home / Self Care | Payer: No Typology Code available for payment source | Source: Ambulatory Visit | Attending: Internal Medicine | Admitting: Internal Medicine

## 2022-05-23 DIAGNOSIS — E559 Vitamin D deficiency, unspecified: Secondary | ICD-10-CM | POA: Diagnosis not present

## 2022-05-23 DIAGNOSIS — E890 Postprocedural hypothyroidism: Secondary | ICD-10-CM | POA: Diagnosis not present

## 2022-05-23 DIAGNOSIS — C73 Malignant neoplasm of thyroid gland: Secondary | ICD-10-CM

## 2022-05-23 LAB — T4, FREE: Free T4: 1.25 ng/dL (ref 0.60–1.60)

## 2022-05-23 LAB — TSH: TSH: 0.01 u[IU]/mL — ABNORMAL LOW (ref 0.35–5.50)

## 2022-05-23 LAB — VITAMIN D 25 HYDROXY (VIT D DEFICIENCY, FRACTURES): VITD: 49.81 ng/mL (ref 30.00–100.00)

## 2022-05-24 LAB — THYROGLOBULIN LEVEL: Thyroglobulin: 0.1 ng/mL — ABNORMAL LOW

## 2022-05-24 LAB — THYROGLOBULIN ANTIBODY: Thyroglobulin Ab: <1 IU/mL (ref <=1)

## 2022-05-25 ENCOUNTER — Other Ambulatory Visit (HOSPITAL_COMMUNITY): Payer: Self-pay

## 2022-05-25 MED ORDER — LEVOTHYROXINE SODIUM 175 MCG PO TABS
175.0000 ug | ORAL_TABLET | Freq: Every day | ORAL | 3 refills | Status: DC
  Filled 2022-05-25: qty 45, 45d supply, fill #0
  Filled 2022-07-07: qty 45, 45d supply, fill #1

## 2022-05-25 NOTE — Addendum Note (Signed)
Addended by: Philemon Kingdom on: 05/25/2022 11:50 AM   Modules accepted: Orders

## 2022-07-07 ENCOUNTER — Other Ambulatory Visit (INDEPENDENT_AMBULATORY_CARE_PROVIDER_SITE_OTHER): Payer: No Typology Code available for payment source

## 2022-07-07 ENCOUNTER — Other Ambulatory Visit (HOSPITAL_COMMUNITY): Payer: Self-pay

## 2022-07-07 DIAGNOSIS — E890 Postprocedural hypothyroidism: Secondary | ICD-10-CM

## 2022-07-07 LAB — T4, FREE: Free T4: 1.3 ng/dL (ref 0.60–1.60)

## 2022-07-07 LAB — TSH: TSH: 0.01 u[IU]/mL — ABNORMAL LOW (ref 0.35–5.50)

## 2022-07-07 MED ORDER — LEVOTHYROXINE SODIUM 150 MCG PO TABS
150.0000 ug | ORAL_TABLET | Freq: Every day | ORAL | 3 refills | Status: DC
  Filled 2022-07-07: qty 45, 45d supply, fill #0
  Filled 2022-09-01: qty 45, 45d supply, fill #1
  Filled 2022-10-17: qty 45, 45d supply, fill #2

## 2022-08-23 ENCOUNTER — Encounter (HOSPITAL_BASED_OUTPATIENT_CLINIC_OR_DEPARTMENT_OTHER): Payer: Self-pay | Admitting: Obstetrics and Gynecology

## 2022-08-23 NOTE — Progress Notes (Addendum)
Your procedure is scheduled on Monday, 09/04/22.  Report to [facility]   Call this number if you have problems the morning of surgery  :[phone].   OUR ADDRESS IS [address].  WE ARE LOCATED IN [facility].  PLEASE BRING YOUR INSURANCE CARD AND PHOTO ID DAY OF SURGERY.  ONLY 2 PEOPLE ARE ALLOWED IN  WAITING  ROOM.                                      REMEMBER:  DO NOT EAT FOOD, CANDY GUM OR MINTS  AFTER MIDNIGHT THE NIGHT BEFORE YOUR SURGERY . YOU MAY HAVE CLEAR LIQUIDS FROM MIDNIGHT THE NIGHT BEFORE YOUR SURGERY UNTIL  4:30 AM. NO CLEAR LIQUIDS AFTER   4:30 AM DAY OF SURGERY.  YOU MAY  BRUSH YOUR TEETH MORNING OF SURGERY AND RINSE YOUR MOUTH OUT, NO CHEWING GUM CANDY OR MINTS.     CLEAR LIQUID DIET   Foods Allowed                                                                     Foods Excluded  Coffee and tea, regular and decaf                             liquids that you cannot  Plain Jell-O                                                                   see through such as: Fruit ices (not with fruit pulp)                                     milk, soups, orange juice  Plain  Popsicles                                    All solid food Carbonated beverages, regular and diet                                    Cranberry, grape and apple juices Sports drinks like Gatorade _____________________________________________________________________     TAKE ONLY THESE MEDICATIONS MORNING OF SURGERY: Synthroid,  Maxalt if needed, Tramadol if needed   UP TO 4 VISITORS  MAY VISIT IN THE EXTENDED RECOVERY ROOM UNTIL 800 PM ONLY.  ONE  VISITOR AGE 60 AND OVER MAY SPEND THE NIGHT AND MUST BE IN EXTENDED RECOVERY ROOM NO LATER THAN 800 PM . YOUR DISCHARGE TIME AFTER YOU SPEND THE NIGHT IS 900 AM THE MORNING AFTER YOUR SURGERY.  YOU MAY PACK A SMALL OVERNIGHT BAG  WITH TOILETRIES FOR YOUR OVERNIGHT STAY IF YOU WISH.  YOUR  PRESCRIPTION MEDICATIONS WILL BE PROVIDED DURING [place].                                      DO NOT WEAR JEWERLY, MAKE UP. DO NOT WEAR LOTIONS, POWDERS, PERFUMES OR NAIL POLISH ON YOUR FINGERNAILS. TOENAIL POLISH IS OK TO WEAR. DO NOT SHAVE FOR 48 HOURS PRIOR TO DAY OF SURGERY. MEN MAY SHAVE FACE AND NECK. CONTACTS, GLASSES, OR DENTURES MAY NOT BE WORN TO SURGERY.  REMEMBER: NO SMOKING, DRUGS OR ALCOHOL FOR 24 HOURS BEFORE YOUR SURGERY.                                    [facility] IS NOT RESPONSIBLE  FOR ANY BELONGINGS.                                                                    Marland Kitchen           [place] - Preparing for Surgery Before surgery, you can play an important role.  Because skin is not sterile, your skin needs to be as free of germs as possible.  You can reduce the number of germs on your skin by washing with CHG (chlorahexidine gluconate) soap before surgery.  CHG is an antiseptic cleaner which kills germs and bonds with the skin to continue killing germs even after washing. Please DO NOT use if you have an allergy to CHG or antibacterial soaps.  If your skin becomes reddened/irritated stop using the CHG and inform your nurse when you arrive at Short Stay. Do not shave (including legs and underarms) for at least 48 hours prior to the first CHG shower.  You may shave your face/neck. Please follow these instructions carefully:  1.  Shower with CHG Soap the night before surgery and the  morning of Surgery.  2.  If you choose to wash your hair, wash your hair first as usual with your  normal  shampoo.  3.  After you shampoo, rinse your hair and body thoroughly to remove the  shampoo.                                        4.  Use CHG as you would any other liquid soap.  You can apply chg directly  to the skin and wash , chg soap provided, night before and morning of your surgery.  5.  Apply the CHG Soap to your body ONLY FROM THE NECK DOWN.   Do not use on face/ open                            Wound or open sores. Avoid contact with eyes, ears mouth and genitals (private parts).                       Wash face,  Development worker, international aid (private parts)  with your normal soap.             6.  Wash thoroughly, paying special attention to the area where your surgery  will be performed.  7.  Thoroughly rinse your body with warm water from the neck down.  8.  DO NOT shower/wash with your normal soap after using and rinsing off  the CHG Soap.             9.  Pat yourself dry with a clean towel.            10.  Wear clean pajamas.            11.  Place clean sheets on your bed the night of your first shower and do not  sleep with pets. Day of Surgery : Do not apply any lotions/deodorants the morning of surgery.  Please wear clean clothes to the hospital/surgery center.  IF YOU HAVE ANY SKIN IRRITATION OR PROBLEMS WITH THE SURGICAL SOAP, PLEASE GET A BAR OF GOLD DIAL SOAP AND SHOWER THE NIGHT BEFORE YOUR SURGERY AND THE MORNING OF YOUR SURGERY. PLEASE LET THE NURSE KNOW MORNING OF YOUR SURGERY IF YOU HAD ANY PROBLEMS WITH THE SURGICAL SOAP.   ________________________________________________________________________                                                        QUESTIONS [facility] PRE OP NURSE PHONE [phone].

## 2022-08-28 ENCOUNTER — Encounter (HOSPITAL_BASED_OUTPATIENT_CLINIC_OR_DEPARTMENT_OTHER): Payer: Self-pay | Admitting: Obstetrics and Gynecology

## 2022-08-28 ENCOUNTER — Other Ambulatory Visit: Payer: Self-pay

## 2022-08-28 ENCOUNTER — Encounter (HOSPITAL_COMMUNITY)
Admission: RE | Admit: 2022-08-28 | Discharge: 2022-08-28 | Disposition: A | Discharge status: Home / Self Care | Payer: 59 | Source: Ambulatory Visit | Attending: Obstetrics and Gynecology | Admitting: Obstetrics and Gynecology

## 2022-08-28 DIAGNOSIS — D259 Leiomyoma of uterus, unspecified: Secondary | ICD-10-CM | POA: Diagnosis not present

## 2022-08-28 DIAGNOSIS — Z01812 Encounter for preprocedural laboratory examination: Secondary | ICD-10-CM | POA: Insufficient documentation

## 2022-08-28 LAB — CBC
HCT: 33.5 % — ABNORMAL LOW (ref 36.0–46.0)
Hemoglobin: 9.8 g/dL — ABNORMAL LOW (ref 12.0–15.0)
MCH: 24 pg — ABNORMAL LOW (ref 26.0–34.0)
MCHC: 29.3 g/dL — ABNORMAL LOW (ref 30.0–36.0)
MCV: 81.9 fL (ref 80.0–100.0)
Platelets: 282 10*3/uL (ref 150–400)
RBC: 4.09 MIL/uL (ref 3.87–5.11)
RDW: 15 % (ref 11.5–15.5)
WBC: 4.5 10*3/uL (ref 4.0–10.5)
nRBC: 0 % (ref 0.0–0.2)

## 2022-08-28 NOTE — Progress Notes (Addendum)
I routed cbc results to Dr. Matthew Saras. Hgb was 9.8 on 08/28/22. I also spoke with Stanton Kidney, scheduler and let her know the lab result. Stanton Kidney said she would lab Dr. Matthew Saras know right away.

## 2022-08-28 NOTE — Progress Notes (Signed)
Spoke w/ via phone for pre-op interview---Suzanne Green needs dos---- urine pregnancy              Green results------08/28/22 Green appt for cbc, type & screen COVID test -----patient states asymptomatic no test needed Arrive at -------0530 on Monday, 09/04/22 NPO after MN NO Solid Food.  Clear liquids from MN until---0430 Med rec completed Medications to take morning of surgery -----Synthroid, Maxalt prn, Tramadol prn Diabetic medication -----n/a Patient instructed no nail polish to be worn day of surgery Patient instructed to bring photo id and insurance card day of surgery Patient aware to have Driver (ride ) / caregiver    for 24 hours after surgery - boyfriend, [facility] Patient Special Instructions -----Extended/ overnight stay instructions given. Pre-Op special Istructions -----none Patient verbalized understanding of instructions that were given at this phone interview. Patient denies shortness of breath, chest pain, fever, cough at this phone interview.

## 2022-08-29 NOTE — H&P (Signed)
[name], Green MEDICAL RECORD NO: [id number] ACCOUNT NO: [id number] DATE OF BIRTH: Jan 14, 1976 FACILITY: [facility] LOCATION: [facility] PHYSICIAN: Ralene Bathe. Matthew Saras, MD  History and Physical   DATE OF ADMISSION: 09/04/2022  She is scheduled for hysterectomy at [facility] on 01/29.  CHIEF COMPLAINT:  Dysmenorrhea secondary to fibroid.  HISTORY OF PRESENT ILLNESS:  A 47 year old G4 P3, prior tubal with a significant chronic dysmenorrhea related to leiomyoma requiring tramadol for pain relief, presents for definitive hysterectomy with bilateral salpingectomy.  09/23 ultrasound demonstrated uterus with an intramural fibroid, 5.4 x 4.2.  No free fluid noted.  Adnexa looked unremarkable.  We reviewed this procedure including specifics regarding risks related to bleeding, infection, adjacent organ injury, wound  infection, phlebitis, the rationale for salpingectomy along with her expected postoperative recovery time reviewed.  PAST MEDICAL HISTORY: ALLERGIES:  HONEY BEE VENOM.  CURRENT MEDICATIONS:  EpiPen p.r.n., levothyroxine, tramadol p.r.n., and vitamin D3.  OBSTETRIC HISTORY: She is a G4, P3.  PAST SURGICAL HISTORY:  She has had breast augmentation, abdominoplasty, prior endometrial ablation, thyroidectomy, tubal ligation, gastric bypass for obesity and cesarean delivery x2.  SOCIAL HISTORY:  She is widowed, she works as a Therapist, sports at [employer]  and never smoker, occasional alcohol use.  PHYSICAL EXAMINATION: VITAL SIGNS:  Temperature 98.2, blood pressure 130/82. HEENT:  Unremarkable. NECK:  Supple, without masses.  Thyroid nonpalpable. HEART:  Clear. LUNGS:  Clear. BREAST EXAM: Without masses, tenderness, or nipple discharge. ABDOMEN:  Reveals that she has had an abdominoplasty and lower transverse incision. PELVIC EXAM:  Vagina and cervix normal.  Uterus upper limit of normal size.  Adnexa negative. EXTREMITIES:  Unremarkable. NEUROLOGIC:  Unremarkable.  ASSESSMENT:  Symptomatic  fibroid with pelvic pain, dysmenorrhea.  PLAN:  TAH, bilateral salpingectomies.  Procedure and risks discussed as above.   NIK D: 08/28/2022 2:09:18 pm T: 08/28/2022 10:17:00 pm  JOB: [id number]/ [id number]

## 2022-08-30 NOTE — Progress Notes (Signed)
Received message on 08/29/22 from [facility], scheduler at Dr. Delanna Ahmadi office concerning Hgb of 9.8. Per Stanton Kidney, no additional orders from Dr. Matthew Saras.

## 2022-09-04 ENCOUNTER — Ambulatory Visit (HOSPITAL_BASED_OUTPATIENT_CLINIC_OR_DEPARTMENT_OTHER)
Admission: RE | Admit: 2022-09-04 | Discharge: 2022-09-05 | Disposition: A | Discharge status: Home / Self Care | Payer: 59 | Attending: Obstetrics and Gynecology | Admitting: Obstetrics and Gynecology

## 2022-09-04 ENCOUNTER — Other Ambulatory Visit: Payer: Self-pay

## 2022-09-04 ENCOUNTER — Encounter (HOSPITAL_BASED_OUTPATIENT_CLINIC_OR_DEPARTMENT_OTHER)
Admission: RE | Disposition: A | Discharge status: Home / Self Care | Payer: Self-pay | Source: Home / Self Care | Attending: Obstetrics and Gynecology

## 2022-09-04 ENCOUNTER — Ambulatory Visit (HOSPITAL_BASED_OUTPATIENT_CLINIC_OR_DEPARTMENT_OTHER): Payer: 59 | Admitting: Anesthesiology

## 2022-09-04 ENCOUNTER — Encounter (HOSPITAL_BASED_OUTPATIENT_CLINIC_OR_DEPARTMENT_OTHER): Payer: Self-pay | Admitting: Obstetrics and Gynecology

## 2022-09-04 DIAGNOSIS — D638 Anemia in other chronic diseases classified elsewhere: Secondary | ICD-10-CM

## 2022-09-04 DIAGNOSIS — D250 Submucous leiomyoma of uterus: Secondary | ICD-10-CM

## 2022-09-04 DIAGNOSIS — D630 Anemia in neoplastic disease: Secondary | ICD-10-CM

## 2022-09-04 DIAGNOSIS — Z01818 Encounter for other preprocedural examination: Secondary | ICD-10-CM

## 2022-09-04 DIAGNOSIS — E039 Hypothyroidism, unspecified: Secondary | ICD-10-CM

## 2022-09-04 DIAGNOSIS — Z6831 Body mass index (BMI) 31.0-31.9, adult: Secondary | ICD-10-CM | POA: Diagnosis not present

## 2022-09-04 DIAGNOSIS — Z86018 Personal history of other benign neoplasm: Secondary | ICD-10-CM | POA: Insufficient documentation

## 2022-09-04 DIAGNOSIS — D259 Leiomyoma of uterus, unspecified: Secondary | ICD-10-CM

## 2022-09-04 DIAGNOSIS — N946 Dysmenorrhea, unspecified: Secondary | ICD-10-CM | POA: Insufficient documentation

## 2022-09-04 DIAGNOSIS — D251 Intramural leiomyoma of uterus: Secondary | ICD-10-CM | POA: Diagnosis not present

## 2022-09-04 DIAGNOSIS — K219 Gastro-esophageal reflux disease without esophagitis: Secondary | ICD-10-CM | POA: Insufficient documentation

## 2022-09-04 DIAGNOSIS — Z9884 Bariatric surgery status: Secondary | ICD-10-CM | POA: Insufficient documentation

## 2022-09-04 DIAGNOSIS — E669 Obesity, unspecified: Secondary | ICD-10-CM | POA: Diagnosis not present

## 2022-09-04 DIAGNOSIS — K449 Diaphragmatic hernia without obstruction or gangrene: Secondary | ICD-10-CM | POA: Diagnosis not present

## 2022-09-04 DIAGNOSIS — D219 Benign neoplasm of connective and other soft tissue, unspecified: Secondary | ICD-10-CM | POA: Diagnosis present

## 2022-09-04 HISTORY — DX: Family history of other specified conditions: Z84.89

## 2022-09-04 HISTORY — DX: Presence of spectacles and contact lenses: Z97.3

## 2022-09-04 HISTORY — DX: Nausea with vomiting, unspecified: R11.2

## 2022-09-04 HISTORY — DX: Patent foramen ovale: Q21.12

## 2022-09-04 HISTORY — DX: Hypothyroidism, unspecified: E03.9

## 2022-09-04 HISTORY — DX: Other complications of anesthesia, initial encounter: T88.59XA

## 2022-09-04 HISTORY — PX: HYSTERECTOMY ABDOMINAL WITH SALPINGECTOMY: SHX6725

## 2022-09-04 HISTORY — DX: Other specified postprocedural states: Z98.890

## 2022-09-04 LAB — TYPE AND SCREEN
ABO/RH(D): A POS
Antibody Screen: NEGATIVE

## 2022-09-04 LAB — POCT PREGNANCY, URINE: Preg Test, Ur: NEGATIVE

## 2022-09-04 SURGERY — HYSTERECTOMY, TOTAL, ABDOMINAL, WITH SALPINGECTOMY
Anesthesia: General | Site: Abdomen | Laterality: Bilateral

## 2022-09-04 MED ORDER — GABAPENTIN 100 MG PO CAPS
ORAL_CAPSULE | ORAL | Status: AC
  Filled 2022-09-04: qty 2

## 2022-09-04 MED ORDER — LIDOCAINE HCL (PF) 2 % IJ SOLN
INTRAMUSCULAR | Status: AC
  Filled 2022-09-04: qty 10

## 2022-09-04 MED ORDER — ONDANSETRON HCL 4 MG PO TABS: 4.0000 mg | ORAL_TABLET | Freq: Four times a day (QID) | ORAL | Status: DC | PRN

## 2022-09-04 MED ORDER — ROCURONIUM BROMIDE 10 MG/ML (PF) SYRINGE
PREFILLED_SYRINGE | INTRAVENOUS | Status: DC | PRN
  Administered 2022-09-04: 90 mg via INTRAVENOUS

## 2022-09-04 MED ORDER — EPHEDRINE 5 MG/ML INJ
INTRAVENOUS | Status: AC
  Filled 2022-09-04: qty 5

## 2022-09-04 MED ORDER — 0.9 % SODIUM CHLORIDE (POUR BTL) OPTIME
TOPICAL | Status: DC | PRN
  Administered 2022-09-04: 2000 mL

## 2022-09-04 MED ORDER — POVIDONE-IODINE 10 % EX SWAB: 2.0000 | Freq: Once | CUTANEOUS | Status: DC

## 2022-09-04 MED ORDER — OXYCODONE HCL 5 MG PO TABS
ORAL_TABLET | ORAL | Status: AC
  Filled 2022-09-04: qty 2

## 2022-09-04 MED ORDER — HYDROMORPHONE HCL 1 MG/ML IJ SOLN
0.2500 mg | INTRAMUSCULAR | Status: DC | PRN
  Administered 2022-09-04: 0.25 mg via INTRAVENOUS
  Administered 2022-09-04: 0.5 mg via INTRAVENOUS
  Administered 2022-09-04: 0.25 mg via INTRAVENOUS

## 2022-09-04 MED ORDER — OXYCODONE HCL 5 MG/5ML PO SOLN: 5.0000 mg | Freq: Once | ORAL | Status: DC | PRN

## 2022-09-04 MED ORDER — LIDOCAINE 2% (20 MG/ML) 5 ML SYRINGE
INTRAMUSCULAR | Status: DC | PRN
  Administered 2022-09-04: 80 mg via INTRAVENOUS

## 2022-09-04 MED ORDER — KETOROLAC TROMETHAMINE 30 MG/ML IJ SOLN
INTRAMUSCULAR | Status: AC
  Filled 2022-09-04: qty 1

## 2022-09-04 MED ORDER — SODIUM CHLORIDE 0.9 % IV SOLN
INTRAVENOUS | Status: AC
  Filled 2022-09-04: qty 2

## 2022-09-04 MED ORDER — MIDAZOLAM HCL 2 MG/2ML IJ SOLN
INTRAMUSCULAR | Status: AC
  Filled 2022-09-04: qty 2

## 2022-09-04 MED ORDER — DEXTROSE IN LACTATED RINGERS 5 % IV SOLN: INTRAVENOUS | Status: DC

## 2022-09-04 MED ORDER — EPHEDRINE SULFATE-NACL 50-0.9 MG/10ML-% IV SOSY
PREFILLED_SYRINGE | INTRAVENOUS | Status: DC | PRN
  Administered 2022-09-04 (×2): 5 mg via INTRAVENOUS

## 2022-09-04 MED ORDER — DEXAMETHASONE SODIUM PHOSPHATE 10 MG/ML IJ SOLN
INTRAMUSCULAR | Status: DC | PRN
  Administered 2022-09-04: 10 mg via INTRAVENOUS

## 2022-09-04 MED ORDER — KETOROLAC TROMETHAMINE 30 MG/ML IJ SOLN
30.0000 mg | Freq: Four times a day (QID) | INTRAMUSCULAR | Status: DC
  Administered 2022-09-04 – 2022-09-05 (×3): 30 mg via INTRAVENOUS

## 2022-09-04 MED ORDER — SUGAMMADEX SODIUM 200 MG/2ML IV SOLN
INTRAVENOUS | Status: DC | PRN
  Administered 2022-09-04: 200 mg via INTRAVENOUS

## 2022-09-04 MED ORDER — BUPIVACAINE LIPOSOME 1.3 % IJ SUSP
INTRAMUSCULAR | Status: DC | PRN
  Administered 2022-09-04: 36 mL

## 2022-09-04 MED ORDER — KETOROLAC TROMETHAMINE 30 MG/ML IJ SOLN: 30.0000 mg | Freq: Four times a day (QID) | INTRAMUSCULAR | Status: DC

## 2022-09-04 MED ORDER — PROPOFOL 10 MG/ML IV BOLUS
INTRAVENOUS | Status: AC
  Filled 2022-09-04: qty 20

## 2022-09-04 MED ORDER — GABAPENTIN 300 MG PO CAPS
300.0000 mg | ORAL_CAPSULE | ORAL | Status: AC
  Administered 2022-09-04: 300 mg via ORAL

## 2022-09-04 MED ORDER — ONDANSETRON HCL 4 MG/2ML IJ SOLN: 4.0000 mg | Freq: Four times a day (QID) | INTRAMUSCULAR | Status: DC | PRN

## 2022-09-04 MED ORDER — MORPHINE SULFATE (PF) 2 MG/ML IV SOLN
INTRAVENOUS | Status: AC
  Filled 2022-09-04: qty 1

## 2022-09-04 MED ORDER — SODIUM CHLORIDE 0.9 % IV SOLN
2.0000 g | INTRAVENOUS | Status: AC
  Administered 2022-09-04: 2 g via INTRAVENOUS

## 2022-09-04 MED ORDER — PROPOFOL 10 MG/ML IV BOLUS
INTRAVENOUS | Status: DC | PRN
  Administered 2022-09-04: 150 mg via INTRAVENOUS

## 2022-09-04 MED ORDER — LACTATED RINGERS IV SOLN: INTRAVENOUS | Status: DC

## 2022-09-04 MED ORDER — PROPOFOL 500 MG/50ML IV EMUL
INTRAVENOUS | Status: DC | PRN
  Administered 2022-09-04: 25 ug/kg/min via INTRAVENOUS

## 2022-09-04 MED ORDER — OXYCODONE HCL 5 MG PO TABS: 5.0000 mg | ORAL_TABLET | Freq: Once | ORAL | Status: DC | PRN

## 2022-09-04 MED ORDER — MORPHINE SULFATE (PF) 4 MG/ML IV SOLN
1.0000 mg | INTRAVENOUS | Status: DC | PRN
  Administered 2022-09-04: 2 mg via INTRAVENOUS

## 2022-09-04 MED ORDER — DIPHENHYDRAMINE HCL 50 MG/ML IJ SOLN
INTRAMUSCULAR | Status: AC
  Filled 2022-09-04: qty 1

## 2022-09-04 MED ORDER — DIPHENHYDRAMINE HCL 50 MG/ML IJ SOLN
INTRAMUSCULAR | Status: DC | PRN
  Administered 2022-09-04: 12.5 mg via INTRAVENOUS

## 2022-09-04 MED ORDER — HYDROMORPHONE HCL 1 MG/ML IJ SOLN
INTRAMUSCULAR | Status: AC
  Filled 2022-09-04: qty 1

## 2022-09-04 MED ORDER — GABAPENTIN 100 MG PO CAPS
200.0000 mg | ORAL_CAPSULE | Freq: Two times a day (BID) | ORAL | Status: DC
  Administered 2022-09-04: 200 mg via ORAL

## 2022-09-04 MED ORDER — OXYCODONE HCL 5 MG PO TABS
5.0000 mg | ORAL_TABLET | ORAL | Status: DC | PRN
  Administered 2022-09-04 (×3): 10 mg via ORAL
  Administered 2022-09-04: 5 mg via ORAL
  Administered 2022-09-05 (×2): 10 mg via ORAL

## 2022-09-04 MED ORDER — PHENYLEPHRINE 80 MCG/ML (10ML) SYRINGE FOR IV PUSH (FOR BLOOD PRESSURE SUPPORT)
PREFILLED_SYRINGE | INTRAVENOUS | Status: AC
  Filled 2022-09-04: qty 10

## 2022-09-04 MED ORDER — MEPERIDINE HCL 25 MG/ML IJ SOLN: 6.2500 mg | INTRAMUSCULAR | Status: DC | PRN

## 2022-09-04 MED ORDER — KETOROLAC TROMETHAMINE 30 MG/ML IJ SOLN
INTRAMUSCULAR | Status: DC | PRN
  Administered 2022-09-04: 30 mg via INTRAVENOUS

## 2022-09-04 MED ORDER — FENTANYL CITRATE (PF) 250 MCG/5ML IJ SOLN
INTRAMUSCULAR | Status: AC
  Filled 2022-09-04: qty 5

## 2022-09-04 MED ORDER — PROMETHAZINE HCL 25 MG/ML IJ SOLN: 6.2500 mg | INTRAMUSCULAR | Status: DC | PRN

## 2022-09-04 MED ORDER — MIDAZOLAM HCL 2 MG/2ML IJ SOLN
INTRAMUSCULAR | Status: DC | PRN
  Administered 2022-09-04: 2 mg via INTRAVENOUS

## 2022-09-04 MED ORDER — MENTHOL 3 MG MT LOZG: 1.0000 | LOZENGE | OROMUCOSAL | Status: DC | PRN

## 2022-09-04 MED ORDER — FENTANYL CITRATE (PF) 250 MCG/5ML IJ SOLN
INTRAMUSCULAR | Status: DC | PRN
  Administered 2022-09-04 (×3): 50 ug via INTRAVENOUS
  Administered 2022-09-04 (×2): 25 ug via INTRAVENOUS

## 2022-09-04 MED ORDER — LEVOTHYROXINE SODIUM 150 MCG PO TABS
150.0000 ug | ORAL_TABLET | Freq: Every day | ORAL | Status: DC
  Administered 2022-09-05: 150 ug via ORAL
  Filled 2022-09-04 (×2): qty 1

## 2022-09-04 MED ORDER — ONDANSETRON HCL 4 MG/2ML IJ SOLN
INTRAMUSCULAR | Status: DC | PRN
  Administered 2022-09-04: 4 mg via INTRAVENOUS

## 2022-09-04 MED ORDER — ONDANSETRON HCL 4 MG/2ML IJ SOLN
INTRAMUSCULAR | Status: AC
  Filled 2022-09-04: qty 4

## 2022-09-04 MED ORDER — DEXAMETHASONE SODIUM PHOSPHATE 10 MG/ML IJ SOLN
INTRAMUSCULAR | Status: AC
  Filled 2022-09-04: qty 2

## 2022-09-04 MED ORDER — GABAPENTIN 300 MG PO CAPS
ORAL_CAPSULE | ORAL | Status: AC
  Filled 2022-09-04: qty 1

## 2022-09-04 SURGICAL SUPPLY — 48 items
APL SKNCLS STERI-STRIP NONHPOA (GAUZE/BANDAGES/DRESSINGS) ×1
BARRIER ADHS 3X4 INTERCEED (GAUZE/BANDAGES/DRESSINGS) IMPLANT
BENZOIN TINCTURE PRP APPL 2/3 (GAUZE/BANDAGES/DRESSINGS) ×1 IMPLANT
BLADE EXTENDED COATED 6.5IN (ELECTRODE) IMPLANT
BRR ADH 4X3 ABS CNTRL BYND (GAUZE/BANDAGES/DRESSINGS)
CELLS DAT CNTRL 66122 CELL SVR (MISCELLANEOUS) IMPLANT
DRAPE WARM FLUID 44X44 (DRAPES) ×1 IMPLANT
DRSG OPSITE POSTOP 4X10 (GAUZE/BANDAGES/DRESSINGS) ×1 IMPLANT
DURAPREP 26ML APPLICATOR (WOUND CARE) ×1 IMPLANT
GAUZE PAD ABD 8X10 STRL (GAUZE/BANDAGES/DRESSINGS) IMPLANT
GLOVE BIO SURGEON STRL SZ7 (GLOVE) ×1 IMPLANT
GOWN STRL REUS W/TWL LRG LVL3 (GOWN DISPOSABLE) ×1 IMPLANT
HIBICLENS CHG 4% 4OZ BTL (MISCELLANEOUS) IMPLANT
HOLDER FOLEY CATH W/STRAP (MISCELLANEOUS) ×1 IMPLANT
KIT TURNOVER CYSTO (KITS) ×1 IMPLANT
LIGASURE IMPACT 36 18CM CVD LR (INSTRUMENTS) IMPLANT
NDL SPNL 22GX3.5 QUINCKE BK (NEEDLE) ×1 IMPLANT
NEEDLE HYPO 22GX1.5 SAFETY (NEEDLE) IMPLANT
NEEDLE SPNL 22GX3.5 QUINCKE BK (NEEDLE) ×1 IMPLANT
PACK ABDOMINAL GYN (CUSTOM PROCEDURE TRAY) ×1 IMPLANT
PAD OB MATERNITY 4.3X12.25 (PERSONAL CARE ITEMS) ×1 IMPLANT
RETRACTOR WND ALEXIS 18 MED (MISCELLANEOUS) IMPLANT
RETRACTOR WND ALEXIS 25 LRG (MISCELLANEOUS) IMPLANT
RTRCTR WOUND ALEXIS 18CM MED (MISCELLANEOUS)
RTRCTR WOUND ALEXIS 18CM SML (INSTRUMENTS)
RTRCTR WOUND ALEXIS 25CM LRG (MISCELLANEOUS) ×1
SAVER CELL AAL HAEMONETICS (INSTRUMENTS) IMPLANT
SPIKE FLUID TRANSFER (MISCELLANEOUS) IMPLANT
SPONGE T-LAP 18X18 ~~LOC~~+RFID (SPONGE) ×1 IMPLANT
STRIP CLOSURE SKIN 1/2X4 (GAUZE/BANDAGES/DRESSINGS) ×1 IMPLANT
SUT MNCRL AB 4-0 PS2 18 (SUTURE) ×1 IMPLANT
SUT MNCRL+ AB 3-0 CT1 36 (SUTURE) IMPLANT
SUT MONOCRYL AB 3-0 CT1 36IN (SUTURE) ×1
SUT PDS AB 0 CTX 60 (SUTURE) ×1 IMPLANT
SUT VIC AB 0 CT1 18XCR BRD8 (SUTURE) ×1 IMPLANT
SUT VIC AB 0 CT1 27 (SUTURE) ×1
SUT VIC AB 0 CT1 27XBRD ANBCTR (SUTURE) IMPLANT
SUT VIC AB 0 CT1 8-18 (SUTURE) ×2
SUT VIC AB 2-0 CT1 27 (SUTURE)
SUT VIC AB 2-0 CT1 TAPERPNT 27 (SUTURE) IMPLANT
SUT VIC AB 3-0 CT1 27 (SUTURE)
SUT VIC AB 3-0 CT1 TAPERPNT 27 (SUTURE) IMPLANT
SUT VICRYL 0 TIES 12 18 (SUTURE) ×1 IMPLANT
SYR CONTROL 10ML LL (SYRINGE) IMPLANT
TAPE CLOTH SURG 6X10 WHT LF (GAUZE/BANDAGES/DRESSINGS) IMPLANT
TOWEL OR 17X26 10 PK STRL BLUE (TOWEL DISPOSABLE) ×1 IMPLANT
TRAY FOLEY W/BAG SLVR 14FR LF (SET/KITS/TRAYS/PACK) ×1 IMPLANT
WATER STERILE IRR 1000ML POUR (IV SOLUTION) ×2 IMPLANT

## 2022-09-04 NOTE — Op Note (Signed)
Preoperative diagnosis: Symptomatic leiomyoma  Postoperative diagnosis: Same  Procedure: TAH  Surgeon: Claudean Kinds  Assistant: Tomlin  EBL: 50 cc  Procedure and findings:  Patient taken to the operating room after an adequate level of general anesthesia was obtained with the patient in supine position the vagina was prepped separately, then the abdomen was prepped after appropriate timeouts were taken.  Foley catheter position at that point.  Transverse incision made through her old C-section and abdominoplasty incision, carried down to the fascia which was incised and extended transversely.  Rectus muscle divided in the midline, peritoneum entered superiorly without incident and extended in a vertical fashion.  There were no significant bowel nor omental adhesions.  Alexis retractor was positioned and bowels packed superiorly out of the field uterus was enlarged 10 to 11 weeks size with known leiomyoma.  She had had a prior salpingectomy for tubal and no significant remnant of the tube was noted on either side.  There was some scarring of the right ovary which was retained suggesting may have had a right cystectomy at some point.  The left ovary.  Normal, upper abdomen including appendix were benign.  Long Kelly clamps were then placed at each utero-ovarian pedicle starting on the left the round ligament was coagulated and divided using the LigaSure device this was carried down with the peritoneum to the midline, the left utero-ovarian pedicle was then isolated clamped divided first free tie followed by suture ligature 0 Vicryl.  Thus the left ovary was conserved.  Exact same repeated on the opposite side conserving the right ovary sharp and blunt dissection was then used to develop the bladder flap which was dissected below the cervicovaginal junction.  Again using the LigaSure the ascending branch of the uterine artery, cardinal ligament and cervical vaginal pedicles were clamped divided and held  temporarily as a specimen was excised.  Vaginal cuff closed with interrupted 2-0 Vicryl sutures.  The pelvis was then irrigated and the operative site was inspected noted be hemostatic.  Prior to closure sponge, needle, instrument counts reported as correct x 2.  Peritoneum closed with a 2-0 Vicryl running suture.  There was good approximation of the rectus muscles from her prior abdominoplasty.  Diluted Exparel with Marcaine was then injected subfascially and submucosally for postoperative analgesia.  Subcutaneous tissue was reapproximated with 2-0 Monocryl suture.  4-0 Monocryl subcuticular skin closure with half-inch Steri-Strips and a pressure dressing applied.  Clear urine noted at the end of the case.  She tolerated this well went to recovery room in good condition. Dictated with [employer]  Margarette Asal MD

## 2022-09-04 NOTE — Anesthesia Postprocedure Evaluation (Signed)
Anesthesia Post Note  Patient: Suzanne Green  Procedure(s) Performed: HYSTERECTOMY ABDOMINAL WITH SALPINGECTOMY (Bilateral: Abdomen)     Patient location during evaluation: PACU Anesthesia Type: General Level of consciousness: awake and alert Pain management: pain level controlled Vital Signs Assessment: post-procedure vital signs reviewed and stable Respiratory status: spontaneous breathing, nonlabored ventilation and respiratory function stable Cardiovascular status: blood pressure returned to baseline and stable Postop Assessment: no apparent nausea or vomiting Anesthetic complications: no   No notable events documented.  Last Vitals:  Vitals:   09/04/22 1000 09/04/22 1029  BP: 134/78 128/79  Pulse: 83 76  Resp: 14 13  Temp: 36.6 C   SpO2: 99% 94%    Last Pain:  Vitals:   09/04/22 1029  TempSrc:   PainSc: Suzanne Green

## 2022-09-04 NOTE — Transfer of Care (Signed)
Immediate Anesthesia Transfer of Care Note  Patient: Suzanne Green  Procedure(s) Performed: HYSTERECTOMY ABDOMINAL WITH SALPINGECTOMY (Bilateral: Abdomen)  Patient Location: PACU  Anesthesia Type:General  Level of Consciousness: drowsy and patient cooperative  Airway & Oxygen Therapy: Patient Spontanous Breathing and Patient connected to nasal cannula oxygen  Post-op Assessment: Report given to RN and Post -op Vital signs reviewed and stable  Post vital signs: Reviewed and stable  Last Vitals:  Vitals Value Taken Time  BP 145/83 09/04/22 0855  Temp    Pulse 76 09/04/22 0859  Resp 14 09/04/22 0859  SpO2 100 % 09/04/22 0859  Vitals shown include unvalidated device data.  Last Pain:  Vitals:   09/04/22 0603  TempSrc: Oral  PainSc: 0-No pain      Patients Stated Pain Goal: 4 (09/92/78 0044)  Complications: No notable events documented.

## 2022-09-04 NOTE — Anesthesia Procedure Notes (Addendum)
Procedure Name: Intubation Date/Time: 09/04/2022 7:31 AM  Performed by: Clearnce Sorrel, CRNAPre-anesthesia Checklist: Patient identified, Emergency Drugs available, Suction available and Patient being monitored Patient Re-evaluated:Patient Re-evaluated prior to induction Oxygen Delivery Method: Circle System Utilized Preoxygenation: Pre-oxygenation with 100% oxygen Induction Type: IV induction Ventilation: Mask ventilation without difficulty Laryngoscope Size: Mac and 3 Grade View: Grade I Tube type: Oral Tube size: 7.0 mm Number of attempts: 1 Airway Equipment and Method: Stylet and Oral airway Placement Confirmation: ETT inserted through vocal cords under direct vision, positive ETCO2 and breath sounds checked- equal and bilateral Secured at: 21 cm Tube secured with: Tape Dental Injury: Teeth and Oropharynx as per pre-operative assessment

## 2022-09-04 NOTE — Anesthesia Preprocedure Evaluation (Signed)
Anesthesia Evaluation  Patient identified by MRN, date of birth, ID band Patient awake    Reviewed: Allergy & Precautions, H&P , NPO status , Patient's Chart, lab work & pertinent test results  History of Anesthesia Complications (+) PONV and history of anesthetic complications  Airway Mallampati: II  TM Distance: <3 FB Neck ROM: Full    Dental  (+) Dental Advisory Given, Teeth Intact   Pulmonary neg pulmonary ROS, neg sleep apnea, neg COPD   Pulmonary exam normal breath sounds clear to auscultation       Cardiovascular (-) Past MI negative cardio ROS Normal cardiovascular exam Rhythm:Regular Rate:Normal     Neuro/Psych  Headaches  negative psych ROS   GI/Hepatic Neg liver ROS, hiatal hernia,GERD  Medicated and Controlled,,  Endo/Other  neg diabetesHypothyroidism  Obesity  Renal/GU negative Renal ROS  negative genitourinary   Musculoskeletal negative musculoskeletal ROS (+)    Abdominal  (+) + obese  Peds negative pediatric ROS (+)  Hematology  (+) Blood dyscrasia, anemia   Anesthesia Other Findings   Reproductive/Obstetrics negative OB ROS                             Anesthesia Physical Anesthesia Plan  ASA: II  Anesthesia Plan: General   Post-op Pain Management: Gabapentin PO (pre-op)* and Dilaudid IV   Induction: Intravenous  PONV Risk Score and Plan: 4 or greater and Treatment may vary due to age or medical condition, Ondansetron, Dexamethasone, Midazolam and Droperidol  Airway Management Planned: Oral ETT  Additional Equipment: None  Intra-op Plan:   Post-operative Plan: Extubation in OR  Informed Consent: I have reviewed the patients History and Physical, chart, labs and discussed the procedure including the risks, benefits and alternatives for the proposed anesthesia with the patient or authorized representative who has indicated his/her understanding and acceptance.      Dental advisory given  Plan Discussed with: CRNA  Anesthesia Plan Comments:         Anesthesia Quick Evaluation

## 2022-09-04 NOTE — Progress Notes (Signed)
The patient was re-examined with no change in status>>> Hgb 9.8, T and S done

## 2022-09-05 ENCOUNTER — Other Ambulatory Visit (HOSPITAL_COMMUNITY): Payer: Self-pay

## 2022-09-05 ENCOUNTER — Encounter (HOSPITAL_BASED_OUTPATIENT_CLINIC_OR_DEPARTMENT_OTHER): Payer: Self-pay | Admitting: Obstetrics and Gynecology

## 2022-09-05 DIAGNOSIS — E039 Hypothyroidism, unspecified: Secondary | ICD-10-CM | POA: Diagnosis not present

## 2022-09-05 DIAGNOSIS — K219 Gastro-esophageal reflux disease without esophagitis: Secondary | ICD-10-CM | POA: Diagnosis not present

## 2022-09-05 DIAGNOSIS — Z86018 Personal history of other benign neoplasm: Secondary | ICD-10-CM | POA: Diagnosis not present

## 2022-09-05 DIAGNOSIS — N946 Dysmenorrhea, unspecified: Secondary | ICD-10-CM | POA: Diagnosis not present

## 2022-09-05 DIAGNOSIS — D251 Intramural leiomyoma of uterus: Secondary | ICD-10-CM | POA: Diagnosis not present

## 2022-09-05 DIAGNOSIS — Z9884 Bariatric surgery status: Secondary | ICD-10-CM | POA: Diagnosis not present

## 2022-09-05 DIAGNOSIS — Z6831 Body mass index (BMI) 31.0-31.9, adult: Secondary | ICD-10-CM | POA: Diagnosis not present

## 2022-09-05 DIAGNOSIS — E669 Obesity, unspecified: Secondary | ICD-10-CM | POA: Diagnosis not present

## 2022-09-05 LAB — CBC
HCT: 28.9 % — ABNORMAL LOW (ref 36.0–46.0)
Hemoglobin: 8.4 g/dL — ABNORMAL LOW (ref 12.0–15.0)
MCH: 24.1 pg — ABNORMAL LOW (ref 26.0–34.0)
MCHC: 29.1 g/dL — ABNORMAL LOW (ref 30.0–36.0)
MCV: 82.8 fL (ref 80.0–100.0)
Platelets: 214 10*3/uL (ref 150–400)
RBC: 3.49 MIL/uL — ABNORMAL LOW (ref 3.87–5.11)
RDW: 15.4 % (ref 11.5–15.5)
WBC: 9 10*3/uL (ref 4.0–10.5)
nRBC: 0 % (ref 0.0–0.2)

## 2022-09-05 LAB — SURGICAL PATHOLOGY

## 2022-09-05 MED ORDER — OXYCODONE HCL 5 MG PO TABS
ORAL_TABLET | ORAL | Status: AC
  Filled 2022-09-05: qty 2

## 2022-09-05 MED ORDER — OXYCODONE HCL 5 MG PO TABS
5.0000 mg | ORAL_TABLET | ORAL | 0 refills | Status: DC | PRN
  Filled 2022-09-05: qty 30, 3d supply, fill #0

## 2022-09-05 MED ORDER — KETOROLAC TROMETHAMINE 30 MG/ML IJ SOLN
INTRAMUSCULAR | Status: AC
  Filled 2022-09-05: qty 1

## 2022-09-05 MED ORDER — IBUPROFEN 200 MG PO TABS
800.0000 mg | ORAL_TABLET | Freq: Three times a day (TID) | ORAL | 0 refills | Status: AC | PRN
  Filled 2022-09-05: qty 30, 3d supply, fill #0

## 2022-09-11 ENCOUNTER — Other Ambulatory Visit (HOSPITAL_COMMUNITY): Payer: Self-pay

## 2022-09-11 MED ORDER — TRAMADOL HCL 50 MG PO TABS
ORAL_TABLET | ORAL | 0 refills | Status: DC
  Filled 2022-09-11: qty 30, 10d supply, fill #0

## 2022-09-15 ENCOUNTER — Encounter: Payer: Self-pay | Admitting: Obstetrics and Gynecology

## 2022-09-20 ENCOUNTER — Other Ambulatory Visit: Payer: Self-pay | Admitting: Obstetrics and Gynecology

## 2022-09-20 ENCOUNTER — Ambulatory Visit
Admission: RE | Admit: 2022-09-20 | Discharge: 2022-09-20 | Disposition: A | Discharge status: Home / Self Care | Payer: 59 | Source: Ambulatory Visit | Attending: Obstetrics and Gynecology | Admitting: Obstetrics and Gynecology

## 2022-09-20 DIAGNOSIS — N6312 Unspecified lump in the right breast, upper inner quadrant: Secondary | ICD-10-CM | POA: Diagnosis not present

## 2022-09-20 DIAGNOSIS — N6311 Unspecified lump in the right breast, upper outer quadrant: Secondary | ICD-10-CM | POA: Diagnosis not present

## 2022-09-20 DIAGNOSIS — N6310 Unspecified lump in the right breast, unspecified quadrant: Secondary | ICD-10-CM

## 2022-10-05 DIAGNOSIS — Z09 Encounter for follow-up examination after completed treatment for conditions other than malignant neoplasm: Secondary | ICD-10-CM | POA: Diagnosis not present

## 2022-10-05 DIAGNOSIS — R102 Pelvic and perineal pain: Secondary | ICD-10-CM | POA: Diagnosis not present

## 2022-11-17 ENCOUNTER — Ambulatory Visit: Payer: 59 | Admitting: Internal Medicine

## 2022-11-17 ENCOUNTER — Other Ambulatory Visit (HOSPITAL_COMMUNITY): Payer: Self-pay

## 2022-11-17 ENCOUNTER — Encounter: Payer: Self-pay | Admitting: Internal Medicine

## 2022-11-17 VITALS — BP 122/78 | HR 58 | Ht 64.0 in | Wt 188.0 lb

## 2022-11-17 DIAGNOSIS — C73 Malignant neoplasm of thyroid gland: Secondary | ICD-10-CM

## 2022-11-17 DIAGNOSIS — Z9882 Breast implant status: Secondary | ICD-10-CM | POA: Insufficient documentation

## 2022-11-17 DIAGNOSIS — E559 Vitamin D deficiency, unspecified: Secondary | ICD-10-CM

## 2022-11-17 DIAGNOSIS — E890 Postprocedural hypothyroidism: Secondary | ICD-10-CM | POA: Diagnosis not present

## 2022-11-17 LAB — T4, FREE: Free T4: 1.22 ng/dL (ref 0.60–1.60)

## 2022-11-17 LAB — TSH: TSH: 0.04 u[IU]/mL — ABNORMAL LOW (ref 0.35–5.50)

## 2022-11-17 MED ORDER — LEVOTHYROXINE SODIUM 137 MCG PO TABS
150.0000 ug | ORAL_TABLET | Freq: Every day | ORAL | 5 refills | Status: DC
  Filled 2022-11-17: qty 45, 45d supply, fill #0
  Filled 2023-02-06: qty 45, 45d supply, fill #1
  Filled 2023-04-11: qty 45, 45d supply, fill #2

## 2022-11-17 NOTE — Progress Notes (Unsigned)
Patient ID: Suzanne Green, female   DOB: 07/28/1976, 47 y.o.   MRN: [id number]  HPI  Suzanne Green is a 47 y.o.-year-old female, returning for follow-up for follicular variant of papillary thyroid cancer and postsurgical hypothyroidism.  She previously saw Dr. Everardo All, last visit 6 months ago.  Interim history: She had TAH in 08/2022 for a fibroid.  She is feeling well after the surgery, but had some bladder symptoms. Otherwise, no complaints at today's visit other than still feeling left-sided pressure in her neck, which is not new.  Reviewed and addended history: Pt. was dx with thyroid cancer in 2018.  She had RAI treatment with no clinical or biochemical persistent or recurrent disease.  Reviewed and addended Dr. George Hugh note: 1/18: Total thyroidectomy  1. Thyroid, thyroidectomy, total - MULTIFOCAL FOLLICULAR VARIANT OF PAPILLARY CARCINOMA, LARGEST FOCUS 2.5 CM, LIMITED TO THYROID GLAND. - SEE ONCOLOGY TABLE. 2. Lymph node, biopsy, central compartment - BENIGN THYMIC TISSUE. - SMALL FRAGMENT OF BENIGN PARATHYROID TISSUE. Specimen: Thyroid gland Procedure (including lymph node sampling if applicable): Total thyroidectomy Specimen Integrity (intact/fragmented): Intact Tumor focality: Multifocal Dominant tumor: Maximum tumor size (cm): 2.5 cm Tumor laterality: Left lobe Histologic type (including subtype and/or unique features as applicable): Follicular variant of papillary carcinoma Tumor capsule: Unencapsulated Extrathyroidal extension: No Capsular invasion with degree of invasion if present: Not applicable Second and additional tumors: Yes Tumor size(s): 0.2 cm and 0.3 cm Tumor laterality: Left lobe Histologic type (including subtype and/or unique features as applicable): Follicular variant of papillary carcinoma Tumor capsule: Unencapsulated Extrathyroidal extension: No Capsular invasion with degree of invasion if present: Not applicable Margins: Negative TNM code:  pT2, pNX Non-neoplastic thyroid: Small hyperplastic nodule. A small fragment of benign parathyroid tissue is also seen adjacent to a thyroid section from the right inferior lobe Comments: Sections show multifocal follicular variant of papillary carcinoma involving left lobe, the largest of which measures 2.5 cm in maximum dimension and limited to thyroid with negative surgical margins of resection. A possible minute focus is also seen in the right lobe (block 1H). In this focus, there is a mostly follicular pattern with slight nuclear atypia in the form of slight enlargement and occasional grooves. 2. Some of the tissue shows cautery artifact and is difficult to evaluate. Where intact, there is benign appearing thymic tissue with no evidence of lymph nodal tissue. A small fragment of benign parathyroid is also present. (BNS:ecj 06/25/2017)  2/19: RAI, 30 mCi, at pt's request.   2/19: post-therapy scan: Small amount of residual tracer accumulation within the cervical region consistent with thyroid remnant.   Neck U/S (05/22/2022): Isthmus: Surgically absent. There is no residual nodular soft tissue within the isthmic resection bed.   Right lobe: Surgically absent. There is no residual nodular soft tissue within the right lobectomy resection bed.   Left lobe: There is a fairly well-defined approximately 0.8 x 0.4 x 0.3 cm hypoechoic nodule within the left thyroidectomy resection bed. _________________________________________________________   No regional cervical lymphadenopathy.   IMPRESSION: Punctate (0.8 cm) hypoechoic nodule within the left thyroidectomy resection bed may represent a non pathologically enlarged cervical lymph node though residual/recurrent thyroid parenchyma could have a similar appearance. Correlation with thyroglobulin levels could be performed as indicated.   Lab Results  Component Value Date   THYROGLB 0.1 (L) 05/23/2022   THYROGLB 0.1 (L) 10/29/2020    THYROGLB <0.1 (L) 09/30/2019   THYROGLB <0.1 (L) 03/25/2019   THYROGLB <0.1 (L) 09/10/2018   THYROGLB <0.1 (L) 03/18/2018  THGAB <1 05/23/2022   THGAB <1 10/29/2020   THGAB <1 09/30/2019   THGAB <1 03/25/2019   THGAB <1 09/10/2018   THGAB <1 03/18/2018   Pt denies: - feeling nodules in neck - hoarseness - choking  She has: - dysphagia - intermittent, not exacerbating - L cervical area tenderness-unchanged  Postsurgical hypothyroidism:  She is on Levothyroxine 150 mcg (decreased to 07/2022), taken: - in am - fasting - at least 2h from b'fast - + calcium - 37min-1h later >> moved >4h later - stopped iron (had TAH for fibroids 08/2022) - + multivitamins - 30min-1h later >> moved >4h later - no PPIs - + on Biotin 5000 mcg daily - stopped 07/2022  I reviewed pt's thyroid tests: Lab Results  Component Value Date   TSH 0.01 (L) 07/07/2022   TSH 0.01 (L) 05/23/2022   TSH 0.05 (L) 10/29/2020   TSH 8.93 (H) 09/30/2019   TSH <0.01 Repeated and verified X2. (L) 03/25/2019   TSH 6.07 (H) 09/10/2018   TSH 0.20 (L) 03/18/2018   TSH 10.06 (H) 01/08/2018   TSH 28.49 (H) 11/12/2017   TSH 44.42 (H) 08/22/2017   FREET4 1.30 07/07/2022   FREET4 1.25 05/23/2022   FREET4 1.16 10/29/2020   FREET4 0.67 09/30/2019   FREET4 0.71 09/10/2018    She has + FH of thyroid disorders in: M, MGM, sister - all hypothyroidism. No FH of thyroid cancer.  No h/o radiation tx to head or neck other than RAI tx.  She had postsurgical hypocalcemia, which resolved: Lab Results  Component Value Date   PTH 35 10/29/2020   PTH 38 03/25/2019   PTH 80 (H) 09/10/2018   PTH 125 (H) 03/18/2018   Lab Results  Component Value Date   CALCIUM 9.1 10/29/2020   CALCIUM 9.7 03/25/2019   CALCIUM 8.8 09/10/2018   CALCIUM 8.7 03/18/2018   CALCIUM 9.2 06/22/2017   CALCIUM 8.7 (L) 06/14/2016   CALCIUM 9.4 03/25/2012   CALCIUM 8.6 12/30/2011   CALCIUM 8.4 12/29/2011   CALCIUM 9.3 12/28/2011   She has  vitamin D deficiency: Lab Results  Component Value Date   VD25OH 49.81 05/23/2022   VD25OH 28.22 (L) 10/29/2020   VD25OH 26.57 (L) 03/25/2019   VD25OH 15.23 (L) 09/10/2018   VD25OH 15.99 (L) 03/18/2018   She is on vitamin D 10,000 units daily.  She does have a history of GERD, hiatal hernia, migraine, anemia.  Also, she had gastric bypass in 2013..  ROS: + see HPI  Past Medical History:  Diagnosis Date   Anemia 2017   Cancer (HCC) 2018   thyroid cancer   Complication of anesthesia    nausea   COVID-19 08/2019   mild symptoms   Family history of adverse reaction to anesthesia    Patient states that her father had a hypertensive crisis while having a vasectomy.   GERD (gastroesophageal reflux disease)    History of kidney stones 06/2016   right kidney   Hypothyroidism    s/p thyroidectomy   Migraines    since 1998, Follows w/ Dr. Alinda Deem in [place].   PFO (patent foramen ovale)    as a baby, closed on it's own   Plantar fasciitis    hx of   PONV (postoperative nausea and vomiting)    Wears glasses    Past Surgical History:  Procedure Laterality Date   abdominal cyst removed  04/2002   AUGMENTATION MAMMAPLASTY  2016   BREAST IMPLANT EXCHANGE  2022  BREATH TEK H PYLORI  09/18/2011   Procedure: BREATH TEK H PYLORI;  Surgeon: Kandis Cocking, MD;  Location: [facility];  Service: General;  Laterality: N/A;   CESAREAN SECTION  12/26/07, 10/31/04   GASTRIC ROUX-EN-Y  12/19/2011   Procedure: LAPAROSCOPIC ROUX-EN-Y GASTRIC;  Surgeon: Kandis Cocking, MD;  Location: [facility];  Service: General;  Laterality: N/A;   HYSTERECTOMY ABDOMINAL WITH SALPINGECTOMY Bilateral 09/04/2022   Procedure: HYSTERECTOMY ABDOMINAL WITH SALPINGECTOMY;  Surgeon: Richarda Overlie, MD;  Location: [facility];  Service: Gynecology;  Laterality: Bilateral;   THYROIDECTOMY N/A 06/21/2017   Procedure: TOTAL THYROIDECTOMY WITH LIMITED LYMPH NODE DISSECTION;  Surgeon: Darnell Level, MD;   Location: [facility];  Service: General;  Laterality: N/A;   TUBAL LIGATION  12/2007   Social History   Socioeconomic History   Marital status: Widowed    Spouse name: Not on file   Number of children: Not on file   Years of education: Not on file   Highest education level: Not on file  Occupational History   Not on file  Tobacco Use   Smoking status: Never   Smokeless tobacco: Never  Vaping Use   Vaping Use: Never used  Substance and Sexual Activity   Alcohol use: Yes    Comment: about 2 or 3 drinks per month   Drug use: No   Sexual activity: Not on file    Comment: tubal ligation  Other Topics Concern   Not on file  Social History Narrative   Not on file   Social Determinants of Health   Financial Resource Strain: Not on file  Food Insecurity: Not on file  Transportation Needs: Not on file  Physical Activity: Not on file  Stress: Not on file  Social Connections: Not on file  Intimate Partner Violence: Not on file   Current Outpatient Medications on File Prior to Visit  Medication Sig Dispense Refill   EPINEPHrine (EPI-PEN) 0.3 mg/0.3 mL DEVI Inject 0.3 mg into the muscle once.      EPINEPHrine (EPIPEN 2-PAK) 0.3 mg/0.3 mL IJ SOAJ injection Use as directed for allergic reactions 2 each 3   ibuprofen (ADVIL) 200 MG tablet Take 4 tablets (800 mg total) by mouth every 8 (eight) hours as needed for moderate pain. 30 tablet 0   levothyroxine (SYNTHROID) 150 MCG tablet Take 1 tablet (150 mcg total) by mouth daily. 45 tablet 3   Multiple Vitamin (MULTIVITAMIN WITH MINERALS) TABS tablet Take 1 tablet by mouth daily.     oxyCODONE (OXY IR/ROXICODONE) 5 MG immediate release tablet Take 1-2 tablets (5-10 mg total) by mouth every 4 (four) hours as needed for moderate pain. 30 tablet 0   rizatriptan (MAXALT) 10 MG tablet Take 1 tablet  by mouth  once, may repeat at 2 hour intervals; do not exceed 3 tablets in 24 hours 16 tablet 0   traMADol (ULTRAM) 50 MG tablet Take 1 tablet by mouth  every 8 hours as needed. 30 tablet 0   No current facility-administered medications on file prior to visit.   Allergies  Allergen Reactions   Bee Venom Anaphylaxis   No family history on file.  PE: There were no vitals taken for this visit. Wt Readings from Last 3 Encounters:  09/04/22 181 lb 12.8 oz (82.5 kg)  08/28/22 183 lb 2 oz (83.1 kg)  05/18/22 182 lb (82.6 kg)   Constitutional: overweight, in NAD Eyes:  EOMI, no exophthalmos ENT: no neck masses palpated, + L mid  cervical lymphadenopathy Cardiovascular: RRR, No MRG Respiratory: CTA B Gastrointestinal: abdomen soft, NT, ND, BS+ Musculoskeletal: no deformities Skin: no rashes Neurological: no tremor with outstretched hands  ASSESSMENT: 1. Thyroid cancer - see HPI  2. Postsurgical Hypothyroidism  3.  Vitamin D deficiency  PLAN:  1.  Follicular variant of papillary thyroid cancer - pathology: [id number], but due to age, she is clinically stage I.  The tumor was multifocal and not encapsulated. -Papillary thyroid cancer is a slow-growing cancer, with good prognosis -She had RAI treatment with a whole-body scan obtained after the treatment only showing uptake in the thyroid remnant, without distant metastasis -Her thyroglobulin levels have been undetectable or barely detectable, and 0.1 at the last 2 checks.  We discussed that this could be related to assay variability and not necessarily to the progression of cancer.  However, we need to continue to keep an eye on her levels.  -At this visit, we will repeat her thyroglobulin and ATA -Since she did not have imaging after her whole-body scan in 2019 and she described an area of sensitivity/discomfort in her mid left neck, which corresponded to slightly enlarged lymph node on palpation, we checked a neck ultrasound at last visit.  This was checked on 05/22/2022 and showed a 0.8 mm hypoechoic nodule in the left thyroidectomy site which could have represented a nonpathologically  enlarged cervical lymph node although residual or recurrent thyroid parenchyma was also possible.  Since the thyroglobulin was very low, we decided to repeat thyroid ultrasound and the thyroglobulin rather than intervening at that time. -Plan to check another ultrasound in 1 year from the previous-will order it at next visit - I will then see the patient back in 6 months  2.  Patient with h/o total thyroidectomy for cancer, now with uncontrolled iatrogenic hypothyroidism, on thyroxine therapy - latest thyroid labs reviewed with pt. >> TSH was still suppressed: Lab Results  Component Value Date   TSH 0.01 (L) 07/07/2022  - she continues on LT4 150 mcg daily, dose gradually reduced since last visit -she did not return for repeat labs after the last dose change - pt feels good on this dose. - we discussed about taking the thyroid hormone every day, with water, >30 minutes before breakfast, separated by >4 hours from acid reflux medications, calcium, iron, multivitamins. Pt. is taking it correctly now, at last visit she was taking calcium and multivitamins too close to levothyroxine and I advised her to move them later - will check thyroid tests today: TSH and fT4 - If labs are abnormal, she will need to return for repeat TFTs in 1.5 months  3.  Vitamin D deficiency -She continues 10,000 units vitamin D daily - latest vit D was normal: Lab Results  Component Value Date   VD25OH 49.81 05/23/2022  -Will continue on the above dose and recheck at next visit  Need 90 day refills.  Component     Latest Ref Rng 11/17/2022  TSH     0.35 - 5.50 uIU/mL 0.04 (L)   T4,Free(Direct)     0.60 - 1.60 ng/dL 1.47    TSH is still suppressed.  We need to decrease the dose of LT4 even further, to 137 mcg daily and repeat the tests  in 1.5 months. Component     Latest Ref Rng 11/17/2022  Thyroglobulin     ng/mL 0.1 (L)   Comment --   Thyroglobulin Ab     < or = 1 IU/mL <1   Tg  stable, at 0.1; negative  ATA.  Carlus Pavlov, MD PhD [facility]

## 2022-11-17 NOTE — Patient Instructions (Addendum)
Please continue Levothyroxine 150 mcg daily.  Take the thyroid hormone every day, with water, at least 30 minutes before breakfast, separated by at least 4 hours from: - acid reflux medications - calcium - iron - multivitamins  Please stop at the lab.  Please return in 6 months.

## 2022-11-20 LAB — THYROGLOBULIN LEVEL: Thyroglobulin: 0.1 ng/mL — ABNORMAL LOW

## 2022-11-20 LAB — THYROGLOBULIN ANTIBODY: Thyroglobulin Ab: <1 IU/mL (ref <=1)

## 2023-02-07 ENCOUNTER — Other Ambulatory Visit: Payer: 59

## 2023-02-26 ENCOUNTER — Other Ambulatory Visit (INDEPENDENT_AMBULATORY_CARE_PROVIDER_SITE_OTHER): Payer: 59

## 2023-02-26 DIAGNOSIS — E890 Postprocedural hypothyroidism: Secondary | ICD-10-CM

## 2023-02-26 LAB — TSH: TSH: 2.12 u[IU]/mL (ref 0.35–5.50)

## 2023-02-26 LAB — T4, FREE: Free T4: 0.81 ng/dL (ref 0.60–1.60)

## 2023-03-06 DIAGNOSIS — D649 Anemia, unspecified: Secondary | ICD-10-CM | POA: Diagnosis not present

## 2023-03-06 DIAGNOSIS — Z0000 Encounter for general adult medical examination without abnormal findings: Secondary | ICD-10-CM | POA: Diagnosis not present

## 2023-03-22 ENCOUNTER — Other Ambulatory Visit: Payer: 59

## 2023-03-29 DIAGNOSIS — D509 Iron deficiency anemia, unspecified: Secondary | ICD-10-CM | POA: Diagnosis not present

## 2023-03-30 ENCOUNTER — Ambulatory Visit
Admission: RE | Admit: 2023-03-30 | Discharge: 2023-03-30 | Disposition: A | Discharge status: Home / Self Care | Payer: 59 | Source: Ambulatory Visit | Attending: Obstetrics and Gynecology | Admitting: Obstetrics and Gynecology

## 2023-03-30 DIAGNOSIS — N6310 Unspecified lump in the right breast, unspecified quadrant: Secondary | ICD-10-CM | POA: Diagnosis not present

## 2023-04-04 ENCOUNTER — Other Ambulatory Visit: Payer: Self-pay | Admitting: Oncology

## 2023-04-04 DIAGNOSIS — Z006 Encounter for examination for normal comparison and control in clinical research program: Secondary | ICD-10-CM

## 2023-04-12 ENCOUNTER — Other Ambulatory Visit (HOSPITAL_COMMUNITY): Payer: Self-pay

## 2023-04-12 MED ORDER — RIZATRIPTAN BENZOATE 10 MG PO TABS
ORAL_TABLET | ORAL | 0 refills | Status: AC
  Filled 2023-04-12: qty 16, 30d supply, fill #0

## 2023-04-13 ENCOUNTER — Other Ambulatory Visit (HOSPITAL_COMMUNITY): Payer: Self-pay

## 2023-04-19 ENCOUNTER — Other Ambulatory Visit (HOSPITAL_COMMUNITY): Payer: Self-pay

## 2023-05-03 ENCOUNTER — Other Ambulatory Visit (HOSPITAL_COMMUNITY): Payer: Self-pay

## 2023-05-03 DIAGNOSIS — D509 Iron deficiency anemia, unspecified: Secondary | ICD-10-CM | POA: Diagnosis not present

## 2023-05-03 MED ORDER — FUSION PLUS PO CAPS
1.0000 | ORAL_CAPSULE | Freq: Every day | ORAL | 1 refills | Status: DC
  Filled 2023-05-03: qty 30, 30d supply, fill #0
  Filled 2023-06-11: qty 90, 90d supply, fill #0
  Filled 2023-09-16: qty 90, 90d supply, fill #1

## 2023-05-14 ENCOUNTER — Other Ambulatory Visit (HOSPITAL_COMMUNITY): Payer: Self-pay

## 2023-05-23 ENCOUNTER — Ambulatory Visit: Payer: 59 | Admitting: Internal Medicine

## 2023-05-23 ENCOUNTER — Encounter: Payer: Self-pay | Admitting: Internal Medicine

## 2023-05-23 VITALS — BP 110/60 | HR 57 | Ht 64.0 in | Wt 194.8 lb

## 2023-05-23 DIAGNOSIS — C73 Malignant neoplasm of thyroid gland: Secondary | ICD-10-CM

## 2023-05-23 DIAGNOSIS — E559 Vitamin D deficiency, unspecified: Secondary | ICD-10-CM

## 2023-05-23 DIAGNOSIS — E890 Postprocedural hypothyroidism: Secondary | ICD-10-CM

## 2023-05-23 LAB — VITAMIN D 25 HYDROXY (VIT D DEFICIENCY, FRACTURES): VITD: 44.56 ng/mL (ref 30.00–100.00)

## 2023-05-23 LAB — TSH: TSH: 1.82 u[IU]/mL (ref 0.35–5.50)

## 2023-05-23 LAB — T4, FREE: Free T4: 1.1 ng/dL (ref 0.60–1.60)

## 2023-05-23 NOTE — Progress Notes (Addendum)
Patient ID: Suzanne Green, female   DOB: May 15, 1976, 47 y.o.   MRN: [id number]  HPI  Suzanne Green is a 47 y.o.-year-old female, returning for follow-up for follicular variant of papillary thyroid cancer and postsurgical hypothyroidism.  She previously saw Dr. Everardo All, last visit 6 months ago.  Interim history: No complaints at today's visit other than still feeling left-sided pressure in her neck, not new. She feels more tired lately. She still has anemia even after TAH (hemoglobin 9.4 at last check reportedly) >> colonoscopy is planned.  She was started back on iron.  Reviewed and addended history: Pt. was dx with thyroid cancer in 2018.  She had RAI treatment with no clinical or biochemical persistent or recurrent disease.  Reviewed and addended Dr. George Hugh note: 1/18: Total thyroidectomy  1. Thyroid, thyroidectomy, total - MULTIFOCAL FOLLICULAR VARIANT OF PAPILLARY CARCINOMA, LARGEST FOCUS 2.5 CM, LIMITED TO THYROID GLAND. - SEE ONCOLOGY TABLE. 2. Lymph node, biopsy, central compartment - BENIGN THYMIC TISSUE. - SMALL FRAGMENT OF BENIGN PARATHYROID TISSUE. Specimen: Thyroid gland Procedure (including lymph node sampling if applicable): Total thyroidectomy Specimen Integrity (intact/fragmented): Intact Tumor focality: Multifocal Dominant tumor: Maximum tumor size (cm): 2.5 cm Tumor laterality: Left lobe Histologic type (including subtype and/or unique features as applicable): Follicular variant of papillary carcinoma Tumor capsule: Unencapsulated Extrathyroidal extension: No Capsular invasion with degree of invasion if present: Not applicable Second and additional tumors: Yes Tumor size(s): 0.2 cm and 0.3 cm Tumor laterality: Left lobe Histologic type (including subtype and/or unique features as applicable): Follicular variant of papillary carcinoma Tumor capsule: Unencapsulated Extrathyroidal extension: No Capsular invasion with degree of invasion if present: Not  applicable Margins: Negative TNM code: pT2, pNX Non-neoplastic thyroid: Small hyperplastic nodule. A small fragment of benign parathyroid tissue is also seen adjacent to a thyroid section from the right inferior lobe Comments: Sections show multifocal follicular variant of papillary carcinoma involving left lobe, the largest of which measures 2.5 cm in maximum dimension and limited to thyroid with negative surgical margins of resection. A possible minute focus is also seen in the right lobe (block 1H). In this focus, there is a mostly follicular pattern with slight nuclear atypia in the form of slight enlargement and occasional grooves. 2. Some of the tissue shows cautery artifact and is difficult to evaluate. Where intact, there is benign appearing thymic tissue with no evidence of lymph nodal tissue. A small fragment of benign parathyroid is also present. (BNS:ecj 06/25/2017)  2/19: RAI, 30 mCi, at pt's request.   2/19: post-therapy scan: Small amount of residual tracer accumulation within the cervical region consistent with thyroid remnant.   Neck U/S (05/22/2022): Isthmus: Surgically absent. There is no residual nodular soft tissue within the isthmic resection bed.   Right lobe: Surgically absent. There is no residual nodular soft tissue within the right lobectomy resection bed.   Left lobe: There is a fairly well-defined approximately 0.8 x 0.4 x 0.3 cm hypoechoic nodule within the left thyroidectomy resection bed. _________________________________________________________   No regional cervical lymphadenopathy.   IMPRESSION: Punctate (0.8 cm) hypoechoic nodule within the left thyroidectomy resection bed may represent a non pathologically enlarged cervical lymph node though residual/recurrent thyroid parenchyma could have a similar appearance. Correlation with thyroglobulin levels could be performed as indicated.   Lab Results  Component Value Date   THYROGLB 0.1 (L)  11/17/2022   THYROGLB 0.1 (L) 05/23/2022   THYROGLB 0.1 (L) 10/29/2020   THYROGLB <0.1 (L) 09/30/2019   THYROGLB <0.1 (L) 03/25/2019   THYROGLB <  0.1 (L) 09/10/2018   THYROGLB <0.1 (L) 03/18/2018   THGAB <1 11/17/2022   THGAB <1 05/23/2022   THGAB <1 10/29/2020   THGAB <1 09/30/2019   THGAB <1 03/25/2019   THGAB <1 09/10/2018   THGAB <1 03/18/2018   Pt denies: - feeling nodules in neck - hoarseness - choking  She has: - dysphagia - intermittent, not exacerbating - L cervical area tenderness-unchanged  Postsurgical hypothyroidism:  She is on Levothyroxine 137 mcg (decreased 11/2022), taken: - in am - fasting - at least 2h from b'fast - + calcium - 62min-1h later >> moved >4h later (at night) - restarted iron (had TAH for fibroids 08/2022) >> at night - + multivitamins - 69min-1h later >> moved >4h later (at night) - no PPIs - + on Biotin 5000 mcg daily - stopped 07/2022  I reviewed pt's thyroid tests: Lab Results  Component Value Date   TSH 2.12 02/26/2023   TSH 0.04 (L) 11/17/2022   TSH 0.01 (L) 07/07/2022   TSH 0.01 (L) 05/23/2022   TSH 0.05 (L) 10/29/2020   TSH 8.93 (H) 09/30/2019   TSH <0.01 Repeated and verified X2. (L) 03/25/2019   TSH 6.07 (H) 09/10/2018   TSH 0.20 (L) 03/18/2018   TSH 10.06 (H) 01/08/2018   FREET4 0.81 02/26/2023   FREET4 1.22 11/17/2022   FREET4 1.30 07/07/2022   FREET4 1.25 05/23/2022   FREET4 1.16 10/29/2020   FREET4 0.67 09/30/2019   FREET4 0.71 09/10/2018    She has + FH of thyroid disorders in: M, MGM, sister - all hypothyroidism. No FH of thyroid cancer.  No h/o radiation tx to head or neck other than RAI tx.  She had postsurgical hypocalcemia, which resolved: Lab Results  Component Value Date   PTH 35 10/29/2020   PTH 38 03/25/2019   PTH 80 (H) 09/10/2018   PTH 125 (H) 03/18/2018   Lab Results  Component Value Date   CALCIUM 9.1 10/29/2020   CALCIUM 9.7 03/25/2019   CALCIUM 8.8 09/10/2018   CALCIUM 8.7 03/18/2018    CALCIUM 9.2 06/22/2017   CALCIUM 8.7 (L) 06/14/2016   CALCIUM 9.4 03/25/2012   CALCIUM 8.6 12/30/2011   CALCIUM 8.4 12/29/2011   CALCIUM 9.3 12/28/2011   She has vitamin D deficiency: Lab Results  Component Value Date   VD25OH 49.81 05/23/2022   VD25OH 28.22 (L) 10/29/2020   VD25OH 26.57 (L) 03/25/2019   VD25OH 15.23 (L) 09/10/2018   VD25OH 15.99 (L) 03/18/2018   She is on vitamin D 10,000 units daily.  She does have a history of GERD, hiatal hernia, migraine, anemia.  Also, she had gastric bypass in 2013. She had TAH in 08/2022 for a fibroid.  She is feeling well after the surgery, but had some bladder symptoms.  ROS: + see HPI  Past Medical History:  Diagnosis Date   Anemia 2017   Cancer (HCC) 2018   thyroid cancer   Complication of anesthesia    nausea   COVID-19 08/2019   mild symptoms   Family history of adverse reaction to anesthesia    Patient states that her father had a hypertensive crisis while having a vasectomy.   GERD (gastroesophageal reflux disease)    History of kidney stones 06/2016   right kidney   Hypothyroidism    s/p thyroidectomy   Migraines    since 1998, Follows w/ Dr. Alinda Deem in [place].   PFO (patent foramen ovale)    as a baby, closed on it's own  Plantar fasciitis    hx of   PONV (postoperative nausea and vomiting)    Wears glasses    Past Surgical History:  Procedure Laterality Date   abdominal cyst removed  04/2002   AUGMENTATION MAMMAPLASTY  2016   BREAST IMPLANT EXCHANGE  2022   BREATH TEK H PYLORI  09/18/2011   Procedure: BREATH TEK H PYLORI;  Surgeon: Kandis Cocking, MD;  Location: [facility];  Service: General;  Laterality: N/A;   CESAREAN SECTION  12/26/07, 10/31/04   GASTRIC ROUX-EN-Y  12/19/2011   Procedure: LAPAROSCOPIC ROUX-EN-Y GASTRIC;  Surgeon: Kandis Cocking, MD;  Location: [facility];  Service: General;  Laterality: N/A;   HYSTERECTOMY ABDOMINAL WITH SALPINGECTOMY Bilateral 09/04/2022   Procedure:  HYSTERECTOMY ABDOMINAL WITH SALPINGECTOMY;  Surgeon: Richarda Overlie, MD;  Location: [facility];  Service: Gynecology;  Laterality: Bilateral;   THYROIDECTOMY N/A 06/21/2017   Procedure: TOTAL THYROIDECTOMY WITH LIMITED LYMPH NODE DISSECTION;  Surgeon: Darnell Level, MD;  Location: [facility];  Service: General;  Laterality: N/A;   TUBAL LIGATION  12/2007   Social History   Socioeconomic History   Marital status: Widowed    Spouse name: Not on file   Number of children: Not on file   Years of education: Not on file   Highest education level: Not on file  Occupational History   Not on file  Tobacco Use   Smoking status: Never   Smokeless tobacco: Never  Vaping Use   Vaping status: Never Used  Substance and Sexual Activity   Alcohol use: Yes    Comment: about 2 or 3 drinks per month   Drug use: No   Sexual activity: Not on file    Comment: tubal ligation  Other Topics Concern   Not on file  Social History Narrative   Not on file   Social Determinants of Health   Financial Resource Strain: Not on file  Food Insecurity: Not on file  Transportation Needs: Not on file  Physical Activity: Not on file  Stress: Not on file  Social Connections: Not on file  Intimate Partner Violence: Not on file   Current Outpatient Medications on File Prior to Visit  Medication Sig Dispense Refill   EPINEPHrine (EPIPEN 2-PAK) 0.3 mg/0.3 mL IJ SOAJ injection Use as directed for allergic reactions 2 each 3   ibuprofen (ADVIL) 200 MG tablet Take 4 tablets (800 mg total) by mouth every 8 (eight) hours as needed for moderate pain. 30 tablet 0   Iron-FA-B Cmp-C-Biot-Probiotic (FUSION PLUS) CAPS Take 1 capsule by mouth daily. 90 capsule 1   levothyroxine (SYNTHROID) 137 MCG tablet Take 1 tablet (137 mcg total) by mouth daily. 45 tablet 5   Multiple Vitamin (MULTIVITAMIN WITH MINERALS) TABS tablet Take 1 tablet by mouth daily.     rizatriptan (MAXALT) 10 MG tablet Take 1 tablet  by mouth   once, may repeat at 2 hour intervals; do not exceed 3 tablets in 24 hours 16 tablet 0   No current facility-administered medications on file prior to visit.   Allergies  Allergen Reactions   Bee Venom Anaphylaxis   No family history on file.  PE: BP 110/60   Pulse (!) 57   Ht 5\' 4"  (1.626 m)   Wt 194 lb 12.8 oz (88.4 kg)   SpO2 99%   BMI 33.44 kg/m  Wt Readings from Last 3 Encounters:  05/23/23 194 lb 12.8 oz (88.4 kg)  11/17/22 188 lb (85.3 kg)  09/04/22 181  lb 12.8 oz (82.5 kg)   Constitutional: overweight, in NAD Eyes:  EOMI, no exophthalmos ENT: + Round nodule palpated in the left lower anterior neck, no cervical lymphadenopathy Cardiovascular: RRR, No MRG Respiratory: CTA B Gastrointestinal: abdomen soft, NT, ND, BS+ Musculoskeletal: no deformities Skin: no rashes Neurological: no tremor with outstretched hands  ASSESSMENT: 1. Thyroid cancer - see HPI  2. Postsurgical Hypothyroidism  3.  Vitamin D deficiency  PLAN:  1.  Follicular variant of papillary thyroid cancer -Pathology:T2 Nx Mx, but she is clinically stage I due to age.  The tumor was multifocal and not encapsulated. -She had RAI treatment with a whole-body scan obtained after the treatment only showing uptake in the thyroid remnant, without distant metastasis  -Thyroglobulin levels have been undetectable or barely detectable, at 0.1 at the last 3 checks.  We discussed that this could be related to acid variability and not necessarily due to the progression of the cancer. -At today's visit we will repeat her thyroglobulin and ATA -Since she did not have imaging after her whole-body scan in 2019 and she described an area of sensitivity/discomfort in her mid left neck, which corresponded to slightly enlarged lymph node on palpation, we checked a thyroid ultrasound 05/2022 and this showed a 0.8 cm hypoechoic nodule in the left thyroidectomy site, which could have represented a nonpathologically enlarged  cervical lymph node although residual or recurrent thyroid parenchyma was also possible.  Since thyroglobulin was very low, we decided to repeat a thyroid ultrasound in the thyroglobulin rather than intervene at that time.   -Will check another ultrasound now -I will then see the patient back in 1 year  2.  Patient with h/o total thyroidectomy for cancer, now with uncontrolled iatrogenic hypothyroidism, on thyroxine therapy - latest thyroid labs reviewed with pt. >> normal: Lab Results  Component Value Date   TSH 2.12 02/26/2023  - she continues on LT4 137 mcg daily, dose decreased at last visit - pt feels good on this dose, but does have fatigue, which could be related to her anemia. - we discussed about taking the thyroid hormone every day, with water, >30 minutes before breakfast, separated by >4 hours from acid reflux medications, calcium, iron, multivitamins. Pt. is taking it correctly. - will check thyroid tests today: TSH and fT4 - If labs are abnormal, she will need to return for repeat TFTs in 1.5 months  3.  Vitamin D deficiency -She continues 10,000 units vitamin D daily -Latest vitamin D level was normal: Lab Results  Component Value Date   VD25OH 49.81 05/23/2022  -Will continue the above dose and recheck the level at next visit  Need 90 day refills.   Component     Latest Ref Rng 05/23/2023  TSH     0.35 - 5.50 uIU/mL 1.82   VITD     30.00 - 100.00 ng/mL 44.56   T4,Free(Direct)     0.60 - 1.60 ng/dL 1.61   Thyroglobulin Ab     < or = 1 IU/mL <1   Thyroglobulin     ng/mL 0.1 (L)   Comment --   Labs are at goal.  Thyroglobulin barely detectable.  Neck U/S (05/30/2023): No enlarged neck lymph node.   6 x 3 x 4 mm hypoechoic solid nodule again seen in the left thyroidectomy bed has slightly decreased in size since prior examination where it measured 8 x 4 x 3 mm.   No new abnormality of the thyroidectomy bed.   IMPRESSION: Previously  seen left  thyroidectomy bed nodule is slightly smaller measuring 6 x 3 x 4 mm on the current exam compared to 8 x 4 x 3 mm on prior exam.  Carlus Pavlov, MD PhD [facility]

## 2023-05-23 NOTE — Patient Instructions (Addendum)
Please continue Levothyroxine 137 mcg daily.  Take the thyroid hormone every day, with water, at least 30 minutes before breakfast, separated by at least 4 hours from: - acid reflux medications - calcium - iron - multivitamins  Please stop at the lab.  Let's get another neck ultrasound.  Please return in 1 year.

## 2023-05-24 LAB — THYROGLOBULIN LEVEL: Thyroglobulin: 0.1 ng/mL — ABNORMAL LOW

## 2023-05-24 LAB — THYROGLOBULIN ANTIBODY: Thyroglobulin Ab: <1 [IU]/mL (ref <=1)

## 2023-05-25 ENCOUNTER — Other Ambulatory Visit (HOSPITAL_COMMUNITY): Payer: Self-pay

## 2023-05-25 MED ORDER — LEVOTHYROXINE SODIUM 137 MCG PO TABS
150.0000 ug | ORAL_TABLET | Freq: Every day | ORAL | 3 refills | Status: DC
  Filled 2023-05-25 – 2023-06-11 (×2): qty 90, 90d supply, fill #0
  Filled 2023-09-16: qty 90, 90d supply, fill #1
  Filled 2023-12-09: qty 90, 90d supply, fill #2
  Filled 2024-03-26: qty 90, 90d supply, fill #3

## 2023-05-30 ENCOUNTER — Inpatient Hospital Stay
Admission: RE | Admit: 2023-05-30 | Discharge: 2023-05-30 | Discharge status: Home / Self Care | Payer: 59 | Source: Ambulatory Visit | Attending: Internal Medicine | Admitting: Internal Medicine

## 2023-05-30 DIAGNOSIS — Z85850 Personal history of malignant neoplasm of thyroid: Secondary | ICD-10-CM | POA: Diagnosis not present

## 2023-05-30 DIAGNOSIS — C73 Malignant neoplasm of thyroid gland: Secondary | ICD-10-CM

## 2023-05-30 DIAGNOSIS — E041 Nontoxic single thyroid nodule: Secondary | ICD-10-CM | POA: Diagnosis not present

## 2023-06-06 ENCOUNTER — Other Ambulatory Visit (HOSPITAL_COMMUNITY): Payer: Self-pay

## 2023-06-06 DIAGNOSIS — D649 Anemia, unspecified: Secondary | ICD-10-CM | POA: Diagnosis not present

## 2023-06-06 DIAGNOSIS — K219 Gastro-esophageal reflux disease without esophagitis: Secondary | ICD-10-CM | POA: Diagnosis not present

## 2023-06-06 DIAGNOSIS — R1310 Dysphagia, unspecified: Secondary | ICD-10-CM | POA: Diagnosis not present

## 2023-06-06 DIAGNOSIS — K5900 Constipation, unspecified: Secondary | ICD-10-CM | POA: Diagnosis not present

## 2023-06-06 MED ORDER — OMEPRAZOLE 40 MG PO CPDR
40.0000 mg | DELAYED_RELEASE_CAPSULE | Freq: Every morning | ORAL | 2 refills | Status: DC
  Filled 2023-06-06: qty 30, 30d supply, fill #0
  Filled 2023-08-05: qty 30, 30d supply, fill #1
  Filled 2023-09-16: qty 30, 30d supply, fill #2

## 2023-06-07 ENCOUNTER — Other Ambulatory Visit (HOSPITAL_COMMUNITY): Payer: Self-pay

## 2023-06-11 ENCOUNTER — Other Ambulatory Visit: Payer: Self-pay

## 2023-06-11 ENCOUNTER — Other Ambulatory Visit (HOSPITAL_COMMUNITY): Payer: Self-pay

## 2023-06-13 ENCOUNTER — Other Ambulatory Visit (HOSPITAL_COMMUNITY): Payer: Self-pay

## 2023-07-17 DIAGNOSIS — D509 Iron deficiency anemia, unspecified: Secondary | ICD-10-CM | POA: Diagnosis not present

## 2023-07-17 DIAGNOSIS — K5900 Constipation, unspecified: Secondary | ICD-10-CM | POA: Diagnosis not present

## 2023-07-17 DIAGNOSIS — Z1211 Encounter for screening for malignant neoplasm of colon: Secondary | ICD-10-CM | POA: Diagnosis not present

## 2023-07-17 DIAGNOSIS — D500 Iron deficiency anemia secondary to blood loss (chronic): Secondary | ICD-10-CM | POA: Diagnosis not present

## 2023-07-17 DIAGNOSIS — Z9884 Bariatric surgery status: Secondary | ICD-10-CM | POA: Diagnosis not present

## 2023-07-17 DIAGNOSIS — R1310 Dysphagia, unspecified: Secondary | ICD-10-CM | POA: Diagnosis not present

## 2023-08-06 ENCOUNTER — Other Ambulatory Visit (HOSPITAL_COMMUNITY): Payer: Self-pay

## 2023-09-17 ENCOUNTER — Other Ambulatory Visit (HOSPITAL_COMMUNITY): Payer: Self-pay

## 2023-09-17 ENCOUNTER — Encounter (HOSPITAL_COMMUNITY): Payer: Self-pay

## 2023-10-03 DIAGNOSIS — K5900 Constipation, unspecified: Secondary | ICD-10-CM | POA: Diagnosis not present

## 2023-10-03 DIAGNOSIS — R1310 Dysphagia, unspecified: Secondary | ICD-10-CM | POA: Diagnosis not present

## 2023-10-03 DIAGNOSIS — D509 Iron deficiency anemia, unspecified: Secondary | ICD-10-CM | POA: Diagnosis not present

## 2023-10-03 DIAGNOSIS — K219 Gastro-esophageal reflux disease without esophagitis: Secondary | ICD-10-CM | POA: Diagnosis not present

## 2023-11-07 ENCOUNTER — Other Ambulatory Visit (HOSPITAL_COMMUNITY): Payer: Self-pay

## 2023-11-07 DIAGNOSIS — E669 Obesity, unspecified: Secondary | ICD-10-CM | POA: Diagnosis not present

## 2023-11-07 DIAGNOSIS — F900 Attention-deficit hyperactivity disorder, predominantly inattentive type: Secondary | ICD-10-CM | POA: Diagnosis not present

## 2023-11-07 DIAGNOSIS — Z6835 Body mass index (BMI) 35.0-35.9, adult: Secondary | ICD-10-CM | POA: Diagnosis not present

## 2023-11-07 DIAGNOSIS — F320 Major depressive disorder, single episode, mild: Secondary | ICD-10-CM | POA: Diagnosis not present

## 2023-11-07 DIAGNOSIS — E890 Postprocedural hypothyroidism: Secondary | ICD-10-CM | POA: Diagnosis not present

## 2023-11-07 DIAGNOSIS — D509 Iron deficiency anemia, unspecified: Secondary | ICD-10-CM | POA: Diagnosis not present

## 2023-11-07 MED ORDER — HYDROXYZINE HCL 10 MG PO TABS
ORAL_TABLET | ORAL | 0 refills | Status: DC
  Filled 2023-11-07: qty 90, 30d supply, fill #0

## 2023-11-07 MED ORDER — LISDEXAMFETAMINE DIMESYLATE 10 MG PO CAPS
10.0000 mg | ORAL_CAPSULE | Freq: Every morning | ORAL | 0 refills | Status: DC
  Filled 2023-11-07: qty 30, 30d supply, fill #0

## 2023-11-07 MED ORDER — ESCITALOPRAM OXALATE 10 MG PO TABS
ORAL_TABLET | ORAL | 0 refills | Status: DC
  Filled 2023-11-07: qty 30, 30d supply, fill #0

## 2023-11-12 ENCOUNTER — Other Ambulatory Visit (HOSPITAL_COMMUNITY): Payer: Self-pay

## 2023-11-12 MED ORDER — LEVOTHYROXINE SODIUM 125 MCG PO TABS
125.0000 ug | ORAL_TABLET | Freq: Every day | ORAL | 0 refills | Status: DC
  Filled 2023-11-12: qty 90, 90d supply, fill #0

## 2023-11-22 ENCOUNTER — Other Ambulatory Visit (HOSPITAL_COMMUNITY): Payer: Self-pay

## 2023-12-09 ENCOUNTER — Other Ambulatory Visit (HOSPITAL_COMMUNITY): Payer: Self-pay

## 2023-12-10 ENCOUNTER — Other Ambulatory Visit (HOSPITAL_COMMUNITY): Payer: Self-pay

## 2023-12-10 MED ORDER — LISDEXAMFETAMINE DIMESYLATE 20 MG PO CAPS
20.0000 mg | ORAL_CAPSULE | Freq: Every morning | ORAL | 0 refills | Status: DC
  Filled 2023-12-10: qty 30, 30d supply, fill #0

## 2023-12-10 MED ORDER — OMEPRAZOLE 40 MG PO CPDR
40.0000 mg | DELAYED_RELEASE_CAPSULE | Freq: Every morning | ORAL | 9 refills | Status: AC
  Filled 2023-12-10: qty 30, 30d supply, fill #0
  Filled 2024-01-11: qty 30, 30d supply, fill #1
  Filled 2024-02-12: qty 30, 30d supply, fill #2
  Filled 2024-05-01: qty 30, 30d supply, fill #3
  Filled 2024-05-28: qty 30, 30d supply, fill #4
  Filled 2024-06-27: qty 30, 30d supply, fill #5
  Filled 2024-09-09: qty 30, 30d supply, fill #6

## 2023-12-10 MED ORDER — ESCITALOPRAM OXALATE 10 MG PO TABS
10.0000 mg | ORAL_TABLET | Freq: Every day | ORAL | 0 refills | Status: DC
Start: 2023-12-10 — End: 2024-01-11
  Filled 2023-12-10: qty 30, 30d supply, fill #0

## 2023-12-11 ENCOUNTER — Other Ambulatory Visit (HOSPITAL_COMMUNITY): Payer: Self-pay

## 2023-12-18 DIAGNOSIS — F900 Attention-deficit hyperactivity disorder, predominantly inattentive type: Secondary | ICD-10-CM | POA: Diagnosis not present

## 2023-12-18 DIAGNOSIS — F320 Major depressive disorder, single episode, mild: Secondary | ICD-10-CM | POA: Diagnosis not present

## 2024-01-11 ENCOUNTER — Other Ambulatory Visit (HOSPITAL_COMMUNITY): Payer: Self-pay

## 2024-01-11 ENCOUNTER — Other Ambulatory Visit (HOSPITAL_BASED_OUTPATIENT_CLINIC_OR_DEPARTMENT_OTHER): Payer: Self-pay

## 2024-01-11 MED ORDER — ESCITALOPRAM OXALATE 10 MG PO TABS
10.0000 mg | ORAL_TABLET | Freq: Every day | ORAL | 1 refills | Status: DC
  Filled 2024-01-11: qty 30, 30d supply, fill #0
  Filled 2024-02-12: qty 30, 30d supply, fill #1

## 2024-01-11 MED ORDER — HYDROXYZINE HCL 10 MG PO TABS
ORAL_TABLET | ORAL | 0 refills | Status: DC
  Filled 2024-01-11: qty 90, 30d supply, fill #0

## 2024-01-11 MED ORDER — LISDEXAMFETAMINE DIMESYLATE 20 MG PO CAPS
20.0000 mg | ORAL_CAPSULE | Freq: Every morning | ORAL | 0 refills | Status: DC
  Filled 2024-01-11: qty 30, 30d supply, fill #0

## 2024-01-12 ENCOUNTER — Other Ambulatory Visit (HOSPITAL_COMMUNITY): Payer: Self-pay

## 2024-02-12 ENCOUNTER — Other Ambulatory Visit (HOSPITAL_COMMUNITY): Payer: Self-pay

## 2024-02-12 MED ORDER — LISDEXAMFETAMINE DIMESYLATE 20 MG PO CAPS
20.0000 mg | ORAL_CAPSULE | Freq: Every morning | ORAL | 0 refills | Status: DC
  Filled 2024-02-12: qty 30, 30d supply, fill #0

## 2024-03-09 ENCOUNTER — Emergency Department (HOSPITAL_COMMUNITY)
Admission: EM | Admit: 2024-03-09 | Discharge: 2024-03-09 | Disposition: A | Discharge status: Home / Self Care | Attending: Student in an Organized Health Care Education/Training Program | Admitting: Student in an Organized Health Care Education/Training Program

## 2024-03-09 ENCOUNTER — Emergency Department (HOSPITAL_COMMUNITY)

## 2024-03-09 ENCOUNTER — Encounter (HOSPITAL_COMMUNITY): Payer: Self-pay

## 2024-03-09 ENCOUNTER — Other Ambulatory Visit: Payer: Self-pay

## 2024-03-09 DIAGNOSIS — M19072 Primary osteoarthritis, left ankle and foot: Secondary | ICD-10-CM | POA: Diagnosis not present

## 2024-03-09 DIAGNOSIS — Z043 Encounter for examination and observation following other accident: Secondary | ICD-10-CM | POA: Diagnosis not present

## 2024-03-09 DIAGNOSIS — S93492A Sprain of other ligament of left ankle, initial encounter: Secondary | ICD-10-CM | POA: Diagnosis not present

## 2024-03-09 DIAGNOSIS — M7732 Calcaneal spur, left foot: Secondary | ICD-10-CM | POA: Diagnosis not present

## 2024-03-09 DIAGNOSIS — M25572 Pain in left ankle and joints of left foot: Secondary | ICD-10-CM | POA: Diagnosis present

## 2024-03-09 DIAGNOSIS — X501XXA Overexertion from prolonged static or awkward postures, initial encounter: Secondary | ICD-10-CM | POA: Insufficient documentation

## 2024-03-09 DIAGNOSIS — S93431A Sprain of tibiofibular ligament of right ankle, initial encounter: Secondary | ICD-10-CM | POA: Diagnosis not present

## 2024-03-09 DIAGNOSIS — S93491A Sprain of other ligament of right ankle, initial encounter: Secondary | ICD-10-CM | POA: Diagnosis not present

## 2024-03-09 MED ORDER — TRAMADOL HCL 50 MG PO TABS: 50.0000 mg | ORAL_TABLET | Freq: Four times a day (QID) | ORAL | 0 refills | Status: DC | PRN

## 2024-03-09 NOTE — ED Provider Notes (Signed)
 [place] EMERGENCY DEPARTMENT AT [facility] Provider Note   CSN: [id number] Arrival date & time: 03/09/24  1640     Patient presents with: Ankle Pain   Suzanne Green is a 48 y.o. female.   Patient to ED with right ankle injury. She stepped out of her vehicle and rolled the ankle, inverting the joint and causing pain and swelling over the lateral aspect of the ankle and lateral proximal forefoot. She has been wearing a brace today and has been able to ambulate for reports increasing pain through the day. No other injury.   The history is provided by the patient. No language interpreter was used.  Ankle Pain      Prior to Admission medications   Medication Sig Start Date End Date Taking? Authorizing Provider  traMADol  (ULTRAM ) 50 MG tablet Take 1 tablet (50 mg total) by mouth every 6 (six) hours as needed. 03/09/24  Yes Odell Balls, PA-C  EPINEPHrine  (EPIPEN  2-PAK) 0.3 mg/0.3 mL IJ SOAJ injection Use as directed for allergic reactions 12/29/21     escitalopram  (LEXAPRO ) 10 MG tablet Take 1 tablet (10 mg total) by mouth daily. 01/11/24     hydrOXYzine  (ATARAX ) 10 MG tablet Take 1 - 3 tablets by mouth up to 3 times daily as needed for anxiety 01/11/24     ibuprofen  (ADVIL ) 200 MG tablet Take 4 tablets (800 mg total) by mouth every 8 (eight) hours as needed for moderate pain. 09/05/22   Johnnye Ade, MD  Iron -FA-B Cmp-C-Biot-Probiotic (FUSION PLUS) CAPS Take 1 capsule by mouth daily. 05/03/23     levothyroxine  (SYNTHROID ) 125 MCG tablet Take 1 tablet (125 mcg total) by mouth daily. 11/12/23     levothyroxine  (SYNTHROID ) 137 MCG tablet Take 1 tablet (137 mcg total) by mouth daily. 05/25/23   Trixie File, MD  lisdexamfetamine (VYVANSE ) 10 MG capsule Take 1 capsule (10 mg total) by mouth every morning. 11/07/23     lisdexamfetamine (VYVANSE ) 20 MG capsule Take 1 capsule (20 mg total) by mouth in the morning. 02/12/24     Multiple Vitamin (MULTIVITAMIN WITH MINERALS) TABS tablet  Take 1 tablet by mouth daily.    [provider]  omeprazole  (PRILOSEC) 40 MG capsule Take 1 (one) capsule every morning 30 minutes before breakfast. 12/10/23     rizatriptan  (MAXALT ) 10 MG tablet Take 1 tablet  by mouth  once, may repeat at 2 hour intervals; do not exceed 3 tablets in 24 hours 04/12/23       Allergies: Bee venom    Review of Systems  Updated Vital Signs BP (!) 146/97 (BP Location: Right Arm)   Pulse 86   Temp 98.4 F (36.9 C) (Oral)   Resp 17   Ht 5' 4 (1.626 m)   Wt 83.9 kg   SpO2 98%   BMI 31.76 kg/m   Physical Exam Constitutional:      General: She is not in acute distress.    Appearance: She is well-developed. She is not ill-appearing.  Pulmonary:     Effort: Pulmonary effort is normal.  Musculoskeletal:        General: Normal range of motion.     Cervical back: Normal range of motion.     Comments: Left ankle mildly swollen with significant bruising or discoloration to lateral malleolus. Tender here and proximal dorsolateral forefoot. No deformity. Achilles intact. Foot and ankle neurovascularly intact.   Skin:    General: Skin is warm and dry.  Neurological:  Mental Status: She is alert and oriented to person, place, and time.     Sensory: No sensory deficit.     (all labs ordered are listed, but only abnormal results are displayed) Labs Reviewed - No data to display  EKG: None  Radiology: DG Ankle Complete Left Result Date: 03/09/2024 CLINICAL DATA:  Fall, twisted ankle. EXAM: LEFT ANKLE COMPLETE - 3+ VIEW; LEFT FOOT - COMPLETE 3+ VIEW COMPARISON:  None Available. FINDINGS: Left ankle: There is no evidence of acute fracture or dislocation. A large calcaneal spur is present. Mild degenerative changes are noted at the ankle. Diffuse soft tissue swelling is noted about the ankle. Left foot: There is no acute fracture or dislocation. A large calcaneal spur is noted. The soft tissues are within normal limits. IMPRESSION: No acute fracture or  dislocation. Electronically Signed   By: Leita Birmingham M.D.   On: 03/09/2024 17:30   DG Foot Complete Left Result Date: 03/09/2024 CLINICAL DATA:  Fall, twisted ankle. EXAM: LEFT ANKLE COMPLETE - 3+ VIEW; LEFT FOOT - COMPLETE 3+ VIEW COMPARISON:  None Available. FINDINGS: Left ankle: There is no evidence of acute fracture or dislocation. A large calcaneal spur is present. Mild degenerative changes are noted at the ankle. Diffuse soft tissue swelling is noted about the ankle. Left foot: There is no acute fracture or dislocation. A large calcaneal spur is noted. The soft tissues are within normal limits. IMPRESSION: No acute fracture or dislocation. Electronically Signed   By: Leita Birmingham M.D.   On: 03/09/2024 17:30     Procedures   Medications Ordered in the ED - No data to display  Clinical Course as of 03/09/24 1753  Sun Mar 09, 2024  1747 Patient with ankle inversion injury this morning. Imaging negative for fracture. ASO and crutches provided. Continue ibuprofen . Tramadol  for severe pain. Follow up with ortho is no better in 3-4 days. [SU]    Clinical Course User Index [SU] Odell Balls, PA-C                                 Medical Decision Making Amount and/or Complexity of Data Reviewed Radiology: ordered.  Risk Prescription drug management.        Final diagnoses:  Sprain of anterior talofibular ligament of left ankle, initial encounter    ED Discharge Orders          Ordered    traMADol  (ULTRAM ) 50 MG tablet  Every 6 hours PRN        03/09/24 1752               Odell Balls, PA-C 03/09/24 1753    Lowther, Amy, DO 03/09/24 2208

## 2024-03-09 NOTE — Discharge Instructions (Addendum)
 Continue ibuprofen  600 mg every 6 hours. Take Tramadol  for severe pain. Weight bearing as tolerated with use of the crutches. If no better in 3-4 days, follow up with Dr. Ernie for further evaluation.

## 2024-03-09 NOTE — ED Triage Notes (Signed)
 Pt states she rolled left ankle this morning and has progressive ankle swelling. Pt took 800mg  of ibuprofen . Denies falling. Pt ambulatory.

## 2024-03-20 ENCOUNTER — Other Ambulatory Visit (HOSPITAL_BASED_OUTPATIENT_CLINIC_OR_DEPARTMENT_OTHER): Payer: Self-pay

## 2024-03-20 DIAGNOSIS — F900 Attention-deficit hyperactivity disorder, predominantly inattentive type: Secondary | ICD-10-CM | POA: Diagnosis not present

## 2024-03-20 DIAGNOSIS — E669 Obesity, unspecified: Secondary | ICD-10-CM | POA: Diagnosis not present

## 2024-03-20 DIAGNOSIS — Z6835 Body mass index (BMI) 35.0-35.9, adult: Secondary | ICD-10-CM | POA: Diagnosis not present

## 2024-03-20 DIAGNOSIS — F320 Major depressive disorder, single episode, mild: Secondary | ICD-10-CM | POA: Diagnosis not present

## 2024-03-20 DIAGNOSIS — E890 Postprocedural hypothyroidism: Secondary | ICD-10-CM | POA: Diagnosis not present

## 2024-03-20 MED ORDER — LISDEXAMFETAMINE DIMESYLATE 30 MG PO CAPS
30.0000 mg | ORAL_CAPSULE | Freq: Every day | ORAL | 0 refills | Status: DC
  Filled 2024-03-20: qty 30, 30d supply, fill #0

## 2024-03-21 ENCOUNTER — Other Ambulatory Visit (HOSPITAL_BASED_OUTPATIENT_CLINIC_OR_DEPARTMENT_OTHER): Payer: Self-pay

## 2024-03-24 ENCOUNTER — Other Ambulatory Visit (HOSPITAL_BASED_OUTPATIENT_CLINIC_OR_DEPARTMENT_OTHER): Payer: Self-pay

## 2024-03-24 MED ORDER — FUSION PLUS PO CAPS
1.0000 | ORAL_CAPSULE | Freq: Every day | ORAL | 1 refills | Status: AC
  Filled 2024-03-24: qty 90, 90d supply, fill #0
  Filled 2024-09-09: qty 90, 90d supply, fill #1

## 2024-03-24 MED ORDER — ESCITALOPRAM OXALATE 10 MG PO TABS
10.0000 mg | ORAL_TABLET | Freq: Every day | ORAL | 1 refills | Status: DC
  Filled 2024-03-24: qty 30, 30d supply, fill #0
  Filled 2024-05-01: qty 30, 30d supply, fill #1

## 2024-03-24 MED ORDER — EPINEPHRINE 0.3 MG/0.3ML IJ SOAJ
0.3000 mg | Freq: Once | INTRAMUSCULAR | 3 refills | Status: AC
  Filled 2024-03-24: qty 2, 2d supply, fill #0

## 2024-03-24 MED ORDER — RIZATRIPTAN BENZOATE 10 MG PO TABS
10.0000 mg | ORAL_TABLET | Freq: Once | ORAL | 1 refills | Status: DC
  Filled 2024-03-24: qty 16, 30d supply, fill #0
  Filled 2024-05-28: qty 16, 30d supply, fill #1

## 2024-03-25 ENCOUNTER — Other Ambulatory Visit (HOSPITAL_BASED_OUTPATIENT_CLINIC_OR_DEPARTMENT_OTHER): Payer: Self-pay

## 2024-03-26 ENCOUNTER — Other Ambulatory Visit (HOSPITAL_BASED_OUTPATIENT_CLINIC_OR_DEPARTMENT_OTHER): Payer: Self-pay

## 2024-03-27 ENCOUNTER — Other Ambulatory Visit (HOSPITAL_BASED_OUTPATIENT_CLINIC_OR_DEPARTMENT_OTHER): Payer: Self-pay

## 2024-04-09 ENCOUNTER — Other Ambulatory Visit (HOSPITAL_BASED_OUTPATIENT_CLINIC_OR_DEPARTMENT_OTHER): Payer: Self-pay

## 2024-04-09 MED ORDER — HYDROXYZINE HCL 10 MG PO TABS
10.0000 mg | ORAL_TABLET | Freq: Three times a day (TID) | ORAL | 0 refills | Status: DC | PRN
  Filled 2024-04-09 – 2024-05-01 (×2): qty 90, 10d supply, fill #0

## 2024-04-10 ENCOUNTER — Other Ambulatory Visit (HOSPITAL_BASED_OUTPATIENT_CLINIC_OR_DEPARTMENT_OTHER): Payer: Self-pay

## 2024-04-22 ENCOUNTER — Other Ambulatory Visit (HOSPITAL_BASED_OUTPATIENT_CLINIC_OR_DEPARTMENT_OTHER): Payer: Self-pay

## 2024-05-01 ENCOUNTER — Other Ambulatory Visit (HOSPITAL_BASED_OUTPATIENT_CLINIC_OR_DEPARTMENT_OTHER): Payer: Self-pay

## 2024-05-01 MED ORDER — LISDEXAMFETAMINE DIMESYLATE 30 MG PO CAPS
30.0000 mg | ORAL_CAPSULE | Freq: Every morning | ORAL | 0 refills | Status: DC
  Filled 2024-05-01: qty 30, 30d supply, fill #0

## 2024-05-22 ENCOUNTER — Encounter: Payer: Self-pay | Admitting: Internal Medicine

## 2024-05-22 ENCOUNTER — Other Ambulatory Visit

## 2024-05-22 ENCOUNTER — Ambulatory Visit: Payer: 59 | Admitting: Internal Medicine

## 2024-05-22 VITALS — BP 120/70 | HR 67 | Ht 64.0 in | Wt 213.6 lb

## 2024-05-22 DIAGNOSIS — E559 Vitamin D deficiency, unspecified: Secondary | ICD-10-CM | POA: Diagnosis not present

## 2024-05-22 DIAGNOSIS — E890 Postprocedural hypothyroidism: Secondary | ICD-10-CM | POA: Diagnosis not present

## 2024-05-22 DIAGNOSIS — C73 Malignant neoplasm of thyroid gland: Secondary | ICD-10-CM

## 2024-05-22 NOTE — Patient Instructions (Signed)
Please continue Levothyroxine 137 mcg daily.  Take the thyroid hormone every day, with water, at least 30 minutes before breakfast, separated by at least 4 hours from: - acid reflux medications - calcium - iron - multivitamins  Please stop at the lab.  Please return in 1 year.

## 2024-05-22 NOTE — Progress Notes (Addendum)
 Patient ID: Suzanne Green, female   DOB: 04-Aug-1976, 48 y.o.   MRN: [id number]  HPI  Suzanne Green is a 48 y.o.-year-old female, returning for follow-up for follicular variant of papillary thyroid  cancer and postsurgical hypothyroidism.  She previously saw Dr. Kassie, last visit 1 year ago.  Interim history: No complaints at today's visit other than still feeling left-sided pressure in her neck, not new. At last visit she had increased fatigue, likely related to anemia.  She was started back on iron . At today's visit, she still describes fatigue, also inability to lose weight, and insomnia.  She was started on Lexapro , Hydroxizine, Vyvanse  to help with insomnia.  Reviewed history: Pt. was dx with thyroid  cancer in 2018.  She had RAI treatment with no clinical or biochemical persistent or recurrent disease.  Reviewed and addended Dr. Laymond note: 1/18: Total thyroidectomy  1. Thyroid , thyroidectomy, total - MULTIFOCAL FOLLICULAR VARIANT OF PAPILLARY CARCINOMA, LARGEST FOCUS 2.5 CM, LIMITED TO THYROID  GLAND. - SEE ONCOLOGY TABLE. 2. Lymph node, biopsy, central compartment - BENIGN THYMIC TISSUE. - SMALL FRAGMENT OF BENIGN PARATHYROID TISSUE. Specimen: Thyroid  gland Procedure (including lymph node sampling if applicable): Total thyroidectomy Specimen Integrity (intact/fragmented): Intact Tumor focality: Multifocal Dominant tumor: Maximum tumor size (cm): 2.5 cm Tumor laterality: Left lobe Histologic type (including subtype and/or unique features as applicable): Follicular variant of papillary carcinoma Tumor capsule: Unencapsulated Extrathyroidal extension: No Capsular invasion with degree of invasion if present: Not applicable Second and additional tumors: Yes Tumor size(s): 0.2 cm and 0.3 cm Tumor laterality: Left lobe Histologic type (including subtype and/or unique features as applicable): Follicular variant of papillary carcinoma Tumor capsule:  Unencapsulated Extrathyroidal extension: No Capsular invasion with degree of invasion if present: Not applicable Margins: Negative TNM code: pT2, pNX Non-neoplastic thyroid : Small hyperplastic nodule. A small fragment of benign parathyroid tissue is also seen adjacent to a thyroid  section from the right inferior lobe Comments: Sections show multifocal follicular variant of papillary carcinoma involving left lobe, the largest of which measures 2.5 cm in maximum dimension and limited to thyroid  with negative surgical margins of resection. A possible minute focus is also seen in the right lobe (block 1H). In this focus, there is a mostly follicular pattern with slight nuclear atypia in the form of slight enlargement and occasional grooves. 2. Some of the tissue shows cautery artifact and is difficult to evaluate. Where intact, there is benign appearing thymic tissue with no evidence of lymph nodal tissue. A small fragment of benign parathyroid is also present. (BNS:ecj 06/25/2017)  2/19: RAI, 30 mCi, at pt's request.   2/19: post-therapy scan: Small amount of residual tracer accumulation within the cervical region consistent with thyroid  remnant.   Neck U/S (05/22/2022): Isthmus: Surgically absent. There is no residual nodular soft tissue within the isthmic resection bed.   Right lobe: Surgically absent. There is no residual nodular soft tissue within the right lobectomy resection bed.   Left lobe: There is a fairly well-defined approximately 0.8 x 0.4 x 0.3 cm hypoechoic nodule within the left thyroidectomy resection bed. _________________________________________________________   No regional cervical lymphadenopathy.   IMPRESSION: Punctate (0.8 cm) hypoechoic nodule within the left thyroidectomy resection bed may represent a non pathologically enlarged cervical lymph node though residual/recurrent thyroid  parenchyma could have a similar appearance. Correlation with thyroglobulin  levels could be performed as indicated. Neck U/S (05/30/2023): No enlarged neck lymph node.   6 x 3 x 4 mm hypoechoic solid nodule again seen in the left thyroidectomy bed has slightly  decreased in size since prior examination where it measured 8 x 4 x 3 mm.   No new abnormality of the thyroidectomy bed.   IMPRESSION: Previously seen left thyroidectomy bed nodule is slightly smaller measuring 6 x 3 x 4 mm on the current exam compared to 8 x 4 x 3 mm on prior exam.  Reviewed thyroglobulin levels: Lab Results  Component Value Date   THYROGLB 0.1 (L) 05/23/2023   THYROGLB 0.1 (L) 11/17/2022   THYROGLB 0.1 (L) 05/23/2022   THYROGLB 0.1 (L) 10/29/2020   THYROGLB <0.1 (L) 09/30/2019   THYROGLB <0.1 (L) 03/25/2019   THYROGLB <0.1 (L) 09/10/2018   THYROGLB <0.1 (L) 03/18/2018   THGAB <1 05/23/2023   THGAB <1 11/17/2022   THGAB <1 05/23/2022   THGAB <1 10/29/2020   THGAB <1 09/30/2019   THGAB <1 03/25/2019   THGAB <1 09/10/2018   THGAB <1 03/18/2018   Pt denies: - feeling nodules in neck - hoarseness - choking  She has: - dysphagia - intermittent, not exacerbating - L cervical area tenderness-unchanged  Postsurgical hypothyroidism:  She is on Levothyroxine  137 mcg (decreased 11/2022), taken: - in am - fasting - at least 2h from b'fast - + calcium  - 80min-1h later >> moved >4h later (at night) - restarted iron  (had TAH for fibroids 08/2022) >> at night - + multivitamins - 74min-1h later >> moved >4h later (at night) - + PPIs at night - off Biotin  5000 mcg daily - stopped 07/2022  I reviewed pt's thyroid  tests: Lab Results  Component Value Date   TSH 1.82 05/23/2023   TSH 2.12 02/26/2023   TSH 0.04 (L) 11/17/2022   TSH 0.01 (L) 07/07/2022   TSH 0.01 (L) 05/23/2022   TSH 0.05 (L) 10/29/2020   TSH 8.93 (H) 09/30/2019   TSH <0.01 Repeated and verified X2. (L) 03/25/2019   TSH 6.07 (H) 09/10/2018   TSH 0.20 (L) 03/18/2018   FREET4 1.10 05/23/2023   FREET4 0.81  02/26/2023   FREET4 1.22 11/17/2022   FREET4 1.30 07/07/2022   FREET4 1.25 05/23/2022   FREET4 1.16 10/29/2020   FREET4 0.67 09/30/2019   FREET4 0.71 09/10/2018    She has + FH of thyroid  disorders in: M, MGM, sister - all hypothyroidism. No FH of thyroid  cancer.  No h/o radiation tx to head or neck other than RAI tx.  She had postsurgical hypocalcemia, which resolved: Lab Results  Component Value Date   PTH 35 10/29/2020   PTH 38 03/25/2019   PTH 80 (H) 09/10/2018   PTH 125 (H) 03/18/2018   Lab Results  Component Value Date   CALCIUM  9.1 10/29/2020   CALCIUM  9.7 03/25/2019   CALCIUM  8.8 09/10/2018   CALCIUM  8.7 03/18/2018   CALCIUM  9.2 06/22/2017   CALCIUM  8.7 (L) 06/14/2016   CALCIUM  9.4 03/25/2012   CALCIUM  8.6 12/30/2011   CALCIUM  8.4 12/29/2011   CALCIUM  9.3 12/28/2011   She has vitamin D  deficiency: Lab Results  Component Value Date   VD25OH 44.56 05/23/2023   VD25OH 49.81 05/23/2022   VD25OH 28.22 (L) 10/29/2020   VD25OH 26.57 (L) 03/25/2019   VD25OH 15.23 (L) 09/10/2018   VD25OH 15.99 (L) 03/18/2018  She is on vitamin D  10,000 units daily.  She does have a history of GERD, hiatal hernia, migraine, anemia.  Also, she had gastric bypass in 2013. She had TAH in 08/2022 for a fibroid.    ROS: + see HPI  Past Medical History:  Diagnosis Date  Anemia 2017   Cancer (HCC) 2018   thyroid  cancer   Complication of anesthesia    nausea   COVID-19 08/2019   mild symptoms   Family history of adverse reaction to anesthesia    Patient states that her father had a hypertensive crisis while having a vasectomy.   GERD (gastroesophageal reflux disease)    History of kidney stones 06/2016   right kidney   Hypothyroidism    s/p thyroidectomy   Migraines    since 1998, Follows w/ Dr. Sharlet Speaks in [place].   PFO (patent foramen ovale)    as a baby, closed on it's own   Plantar fasciitis    hx of   PONV (postoperative nausea and vomiting)    Wears glasses     Past Surgical History:  Procedure Laterality Date   abdominal cyst removed  04/2002   AUGMENTATION MAMMAPLASTY  2016   BREAST IMPLANT EXCHANGE  2022   BREATH TEK H PYLORI  09/18/2011   Procedure: BREATH TEK H PYLORI;  Surgeon: Alm VEAR Angle, MD;  Location: [facility];  Service: General;  Laterality: N/A;   CESAREAN SECTION  12/26/07, 10/31/04   GASTRIC ROUX-EN-Y  12/19/2011   Procedure: LAPAROSCOPIC ROUX-EN-Y GASTRIC;  Surgeon: Alm VEAR Angle, MD;  Location: [facility];  Service: General;  Laterality: N/A;   HYSTERECTOMY ABDOMINAL WITH SALPINGECTOMY Bilateral 09/04/2022   Procedure: HYSTERECTOMY ABDOMINAL WITH SALPINGECTOMY;  Surgeon: Johnnye Ade, MD;  Location[facility];  Service: Gynecology;  Laterality: Bilateral;   THYROIDECTOMY N/A 06/21/2017   Procedure: TOTAL THYROIDECTOMY WITH LIMITED LYMPH NODE DISSECTION;  Surgeon: Eletha Boas, MD;  Location: [facility];  Service: General;  Laterality: N/A;   TUBAL LIGATION  12/2007   Social History   Socioeconomic History   Marital status: Widowed    Spouse name: Not on file   Number of children: Not on file   Years of education: Not on file   Highest education level: Not on file  Occupational History   Not on file  Tobacco Use   Smoking status: Never   Smokeless tobacco: Never  Vaping Use   Vaping status: Never Used  Substance and Sexual Activity   Alcohol use: Yes    Comment: about 2 or 3 drinks per month   Drug use: No   Sexual activity: Not on file    Comment: tubal ligation  Other Topics Concern   Not on file  Social History Narrative   Not on file   Social Drivers of Health   Financial Resource Strain: Not on file  Food Insecurity: Not on file  Transportation Needs: Not on file  Physical Activity: Not on file  Stress: Not on file  Social Connections: Not on file  Intimate Partner Violence: Not on file   Current Outpatient Medications on File Prior to Visit  Medication Sig Dispense Refill    EPINEPHrine  (EPIPEN  2-PAK) 0.3 mg/0.3 mL IJ SOAJ injection Use as directed for allergic reactions 2 each 3   escitalopram  (LEXAPRO ) 10 MG tablet Take 1 tablet (10 mg total) by mouth daily. 30 tablet 1   hydrOXYzine  (ATARAX ) 10 MG tablet Take 1-3 tablets (10-30 mg total) by mouth up to 3 (three) times daily as needed for anxiety. 90 tablet 0   ibuprofen  (ADVIL ) 200 MG tablet Take 4 tablets (800 mg total) by mouth every 8 (eight) hours as needed for moderate pain. 30 tablet 0   Iron -FA-B Cmp-C-Biot-Probiotic (FUSION PLUS) CAPS Take 1 capsule by mouth  daily. 90 capsule 1   levothyroxine  (SYNTHROID ) 125 MCG tablet Take 1 tablet (125 mcg total) by mouth daily. 90 tablet 0   levothyroxine  (SYNTHROID ) 137 MCG tablet Take 1 tablet (137 mcg total) by mouth daily. 90 tablet 3   lisdexamfetamine (VYVANSE ) 10 MG capsule Take 1 capsule (10 mg total) by mouth every morning. 30 capsule 0   lisdexamfetamine (VYVANSE ) 20 MG capsule Take 1 capsule (20 mg total) by mouth in the morning. 30 capsule 0   lisdexamfetamine (VYVANSE ) 30 MG capsule Take 1 capsule (30 mg total) by mouth in the morning. 30 capsule 0   Multiple Vitamin (MULTIVITAMIN WITH MINERALS) TABS tablet Take 1 tablet by mouth daily.     omeprazole  (PRILOSEC) 40 MG capsule Take 1 (one) capsule every morning 30 minutes before breakfast. 30 capsule 9   rizatriptan  (MAXALT ) 10 MG tablet Take 1 tablet  by mouth  once, may repeat at 2 hour intervals; do not exceed 3 tablets in 24 hours 16 tablet 0   rizatriptan  (MAXALT ) 10 MG tablet Take 1 tablet by mouth  once, may repeat at 2 hour intervals; do not exceed 3 tablets in 24 hours 16 tablet 1   traMADol  (ULTRAM ) 50 MG tablet Take 1 tablet (50 mg total) by mouth every 6 (six) hours as needed. 8 tablet 0   No current facility-administered medications on file prior to visit.   Allergies  Allergen Reactions   Bee Venom Anaphylaxis   No family history on file.  PE: BP 120/70   Pulse 67   Ht 5' 4 (1.626 m)    Wt 213 lb 9.6 oz (96.9 kg)   SpO2 97%   BMI 36.66 kg/m  Wt Readings from Last 3 Encounters:  05/22/24 213 lb 9.6 oz (96.9 kg)  03/09/24 185 lb (83.9 kg)  05/23/23 194 lb 12.8 oz (88.4 kg)   Constitutional: overweight, in NAD Eyes:  EOMI, no exophthalmos ENT: + Round nodule palpated in the left lower anterior neck, no cervical lymphadenopathy Cardiovascular: RRR, No MRG Respiratory: CTA B Musculoskeletal: no deformities Skin: no rashes Neurological: no tremor with outstretched hands  ASSESSMENT: 1. Thyroid  cancer - see HPI  2. Postsurgical Hypothyroidism  3.  Vitamin D  deficiency  PLAN:  1.  Follicular variant of papillary thyroid  cancer -Pathology:T2 Nx Mx, but she is clinically stage I due to age.  The tumor was multifocal and not encapsulated. -She had RAI treatment with a whole-body scan obtained after the treatment only showing uptake in the thyroid  remnant, without distant metastasis  - Globulin levels have been undetectable or barely detectable, at 0.1, at the last 4 checks 4 checks.  We discussed that this could be related to assay variability and not necessarily due to progression of the cancer. - We we will repeat her thyroglobulin and ATA today -Since she did not have imaging after her whole-body scan in 2019 and she described an area of sensitivity/discomfort in her mid left neck, which corresponded to slightly enlarged lymph node on palpation, we checked a thyroid  ultrasound 05/2022 and this showed a 0.8 cm hypoechoic nodule in the left thyroidectomy site, which could have represented a nonpathologically enlarged cervical lymph node although residual or recurrent thyroid  parenchyma was also possible.  Since thyroglobulin was very low, we decided to repeat a thyroid  ultrasound in the thyroglobulin rather than intervene at that time.  We repeated a thyroid  ultrasound at last visit and this showed that the left thyroid  bed nodule was slightly smaller, at 6  x 3 x 4 mm.  Plan  to check another neck ultrasound next year. -I will then see the patient back in 1 year  2.  Patient with h/o total thyroidectomy for cancer, now with uncontrolled iatrogenic hypothyroidism, on thyroxine therapy - latest thyroid  labs reviewed with pt. >> normal: Lab Results  Component Value Date   TSH 1.82 05/23/2023  - she continues on LT4 137 mcg daily - The, she describes increased fatigue, and also inability to lose weight.  On our scale, she gained approximately 19 pounds since our last visit.  We did discuss that this may be related to low perimenopause.  She had a TAH last year.  If the thyroid  tests are normal today, I advised her to discuss with OB/GYN to see if she is a candidate for HRT. - we discussed about taking the thyroid  hormone every day, with water, >30 minutes before breakfast, separated by >4 hours from acid reflux medications, calcium , iron , multivitamins. Pt. is taking it correctly. - will check thyroid  tests today: TSH and fT4 - If labs are abnormal, she will need to return for repeat TFTs in 1.5 months  3.  Vitamin D  deficiency - She continues 10,000 units of vitamin D  daily - Latest vitamin D  level was normal: Lab Results  Component Value Date   VD25OH 44.56 05/23/2023  - Will continue the above dose of supplement and recheck her level today  Orders Placed This Encounter  Procedures   TSH   T4, free   Thyroglobulin Level   Thyroglobulin antibody   VITAMIN D  25 Hydroxy (Vit-D Deficiency, Fractures)   Need 90 day refills.  Component     Latest Ref Rng 05/22/2024  TSH     mIU/L 2.42   T4,Free(Direct)     0.8 - 1.8 ng/dL 0.9   Vitamin D , 25-Hydroxy     30 - 100 ng/mL 33   TFTs are normal.  Will wait for the thyroglobulin level to return.  If this is higher, we may need to increase her LT4 dose.  Lela Fendt, MD PhD [facility] Endocrinology

## 2024-05-23 ENCOUNTER — Ambulatory Visit: Payer: Self-pay | Admitting: Internal Medicine

## 2024-05-26 LAB — THYROGLOBULIN ANTIBODY: Thyroglobulin Ab: <1 [IU]/mL (ref <=1)

## 2024-05-26 LAB — T4, FREE: Free T4: 0.9 ng/dL (ref 0.8–1.8)

## 2024-05-26 LAB — THYROGLOBULIN LEVEL: Thyroglobulin: 0.1 ng/mL — ABNORMAL LOW

## 2024-05-26 LAB — VITAMIN D 25 HYDROXY (VIT D DEFICIENCY, FRACTURES): Vit D, 25-Hydroxy: 33 ng/mL (ref 30–100)

## 2024-05-26 LAB — TSH: TSH: 2.42 m[IU]/L

## 2024-05-27 ENCOUNTER — Other Ambulatory Visit (HOSPITAL_COMMUNITY): Payer: Self-pay

## 2024-05-27 MED ORDER — LEVOTHYROXINE SODIUM 137 MCG PO TABS
150.0000 ug | ORAL_TABLET | Freq: Every day | ORAL | 3 refills | Status: AC
  Filled 2024-05-27 – 2024-06-27 (×2): qty 90, 90d supply, fill #0

## 2024-05-27 NOTE — Addendum Note (Signed)
 Addended by: TRIXIE FILE on: 05/27/2024 11:29 AM   Modules accepted: Orders

## 2024-05-28 ENCOUNTER — Other Ambulatory Visit (HOSPITAL_BASED_OUTPATIENT_CLINIC_OR_DEPARTMENT_OTHER): Payer: Self-pay

## 2024-05-28 MED ORDER — ESCITALOPRAM OXALATE 10 MG PO TABS
10.0000 mg | ORAL_TABLET | Freq: Every day | ORAL | 1 refills | Status: DC
  Filled 2024-05-28: qty 30, 30d supply, fill #0
  Filled 2024-06-27: qty 30, 30d supply, fill #1

## 2024-05-28 MED ORDER — HYDROXYZINE HCL 10 MG PO TABS
10.0000 mg | ORAL_TABLET | Freq: Three times a day (TID) | ORAL | 0 refills | Status: AC | PRN
  Filled 2024-05-28: qty 90, 10d supply, fill #0

## 2024-05-29 ENCOUNTER — Other Ambulatory Visit (HOSPITAL_BASED_OUTPATIENT_CLINIC_OR_DEPARTMENT_OTHER): Payer: Self-pay

## 2024-05-29 MED ORDER — LISDEXAMFETAMINE DIMESYLATE 30 MG PO CAPS
30.0000 mg | ORAL_CAPSULE | Freq: Every day | ORAL | 0 refills | Status: DC
  Filled 2024-05-29: qty 30, 30d supply, fill #0

## 2024-06-10 ENCOUNTER — Other Ambulatory Visit: Payer: Self-pay | Admitting: Medical Genetics

## 2024-06-10 DIAGNOSIS — Z006 Encounter for examination for normal comparison and control in clinical research program: Secondary | ICD-10-CM

## 2024-06-26 ENCOUNTER — Other Ambulatory Visit (HOSPITAL_BASED_OUTPATIENT_CLINIC_OR_DEPARTMENT_OTHER): Payer: Self-pay

## 2024-06-27 ENCOUNTER — Other Ambulatory Visit (HOSPITAL_BASED_OUTPATIENT_CLINIC_OR_DEPARTMENT_OTHER): Payer: Self-pay

## 2024-07-01 ENCOUNTER — Other Ambulatory Visit (HOSPITAL_BASED_OUTPATIENT_CLINIC_OR_DEPARTMENT_OTHER): Payer: Self-pay

## 2024-07-01 MED ORDER — LISDEXAMFETAMINE DIMESYLATE 30 MG PO CAPS
30.0000 mg | ORAL_CAPSULE | Freq: Every morning | ORAL | 0 refills | Status: AC
  Filled 2024-07-01: qty 30, 30d supply, fill #0

## 2024-07-29 ENCOUNTER — Encounter (HOSPITAL_BASED_OUTPATIENT_CLINIC_OR_DEPARTMENT_OTHER): Payer: Self-pay | Admitting: Pharmacy Technician

## 2024-07-29 ENCOUNTER — Other Ambulatory Visit (HOSPITAL_BASED_OUTPATIENT_CLINIC_OR_DEPARTMENT_OTHER): Payer: Self-pay

## 2024-07-29 DIAGNOSIS — G43009 Migraine without aura, not intractable, without status migrainosus: Secondary | ICD-10-CM | POA: Diagnosis not present

## 2024-07-29 DIAGNOSIS — E669 Obesity, unspecified: Secondary | ICD-10-CM | POA: Diagnosis not present

## 2024-07-29 DIAGNOSIS — E890 Postprocedural hypothyroidism: Secondary | ICD-10-CM | POA: Diagnosis not present

## 2024-07-29 DIAGNOSIS — F320 Major depressive disorder, single episode, mild: Secondary | ICD-10-CM | POA: Diagnosis not present

## 2024-07-29 DIAGNOSIS — Z6835 Body mass index (BMI) 35.0-35.9, adult: Secondary | ICD-10-CM | POA: Diagnosis not present

## 2024-07-29 DIAGNOSIS — F900 Attention-deficit hyperactivity disorder, predominantly inattentive type: Secondary | ICD-10-CM | POA: Diagnosis not present

## 2024-07-29 DIAGNOSIS — D509 Iron deficiency anemia, unspecified: Secondary | ICD-10-CM | POA: Diagnosis not present

## 2024-07-29 MED ORDER — LISDEXAMFETAMINE DIMESYLATE 40 MG PO CAPS
40.0000 mg | ORAL_CAPSULE | Freq: Every day | ORAL | 0 refills | Status: DC
  Filled 2024-07-29: qty 30, 30d supply, fill #0

## 2024-07-29 MED ORDER — QULIPTA 60 MG PO TABS
60.0000 mg | ORAL_TABLET | Freq: Every day | ORAL | 0 refills | Status: AC
  Filled 2024-07-29: qty 90, 90d supply, fill #0

## 2024-07-30 ENCOUNTER — Other Ambulatory Visit (HOSPITAL_BASED_OUTPATIENT_CLINIC_OR_DEPARTMENT_OTHER): Payer: Self-pay

## 2024-08-04 ENCOUNTER — Other Ambulatory Visit (HOSPITAL_BASED_OUTPATIENT_CLINIC_OR_DEPARTMENT_OTHER): Payer: Self-pay

## 2024-08-04 MED ORDER — LEVOTHYROXINE SODIUM 125 MCG PO TABS
125.0000 ug | ORAL_TABLET | Freq: Every day | ORAL | 0 refills | Status: AC
  Filled 2024-08-04: qty 90, 90d supply, fill #0

## 2024-08-05 ENCOUNTER — Other Ambulatory Visit (HOSPITAL_BASED_OUTPATIENT_CLINIC_OR_DEPARTMENT_OTHER): Payer: Self-pay

## 2024-08-13 ENCOUNTER — Other Ambulatory Visit (HOSPITAL_BASED_OUTPATIENT_CLINIC_OR_DEPARTMENT_OTHER): Payer: Self-pay

## 2024-08-19 ENCOUNTER — Other Ambulatory Visit (HOSPITAL_BASED_OUTPATIENT_CLINIC_OR_DEPARTMENT_OTHER): Payer: Self-pay

## 2024-08-20 ENCOUNTER — Other Ambulatory Visit (HOSPITAL_BASED_OUTPATIENT_CLINIC_OR_DEPARTMENT_OTHER): Payer: Self-pay

## 2024-08-21 ENCOUNTER — Other Ambulatory Visit (HOSPITAL_BASED_OUTPATIENT_CLINIC_OR_DEPARTMENT_OTHER): Payer: Self-pay

## 2024-08-25 ENCOUNTER — Other Ambulatory Visit (HOSPITAL_BASED_OUTPATIENT_CLINIC_OR_DEPARTMENT_OTHER): Payer: Self-pay

## 2024-08-27 ENCOUNTER — Other Ambulatory Visit (HOSPITAL_BASED_OUTPATIENT_CLINIC_OR_DEPARTMENT_OTHER): Payer: Self-pay

## 2024-09-01 ENCOUNTER — Other Ambulatory Visit (HOSPITAL_BASED_OUTPATIENT_CLINIC_OR_DEPARTMENT_OTHER): Payer: Self-pay

## 2024-09-02 ENCOUNTER — Other Ambulatory Visit (HOSPITAL_BASED_OUTPATIENT_CLINIC_OR_DEPARTMENT_OTHER): Payer: Self-pay

## 2024-09-03 ENCOUNTER — Other Ambulatory Visit (HOSPITAL_BASED_OUTPATIENT_CLINIC_OR_DEPARTMENT_OTHER): Payer: Self-pay

## 2024-09-04 ENCOUNTER — Other Ambulatory Visit (HOSPITAL_BASED_OUTPATIENT_CLINIC_OR_DEPARTMENT_OTHER): Payer: Self-pay

## 2024-09-05 ENCOUNTER — Other Ambulatory Visit (HOSPITAL_BASED_OUTPATIENT_CLINIC_OR_DEPARTMENT_OTHER): Payer: Self-pay

## 2024-09-09 ENCOUNTER — Other Ambulatory Visit: Payer: Self-pay

## 2024-09-09 ENCOUNTER — Encounter (HOSPITAL_BASED_OUTPATIENT_CLINIC_OR_DEPARTMENT_OTHER): Payer: Self-pay | Admitting: Pharmacist

## 2024-09-09 ENCOUNTER — Other Ambulatory Visit (HOSPITAL_BASED_OUTPATIENT_CLINIC_OR_DEPARTMENT_OTHER): Payer: Self-pay

## 2024-09-09 MED ORDER — RIZATRIPTAN BENZOATE 10 MG PO TABS
10.0000 mg | ORAL_TABLET | Freq: Once | ORAL | 1 refills | Status: AC
  Filled 2024-09-09: qty 16, 30d supply, fill #0

## 2024-09-09 MED ORDER — ESCITALOPRAM OXALATE 10 MG PO TABS
10.0000 mg | ORAL_TABLET | Freq: Every day | ORAL | 1 refills | Status: AC
  Filled 2024-09-09: qty 30, 30d supply, fill #0

## 2024-09-09 MED ORDER — EPINEPHRINE 0.3 MG/0.3ML IJ SOAJ
INTRAMUSCULAR | 3 refills | Status: AC
  Filled 2024-09-09: qty 2, 2d supply, fill #0

## 2024-09-09 MED ORDER — LISDEXAMFETAMINE DIMESYLATE 40 MG PO CAPS
40.0000 mg | ORAL_CAPSULE | Freq: Every day | ORAL | 0 refills | Status: AC
  Filled 2024-09-09: qty 30, 30d supply, fill #0

## 2025-05-22 ENCOUNTER — Ambulatory Visit: Admitting: Internal Medicine
